# Patient Record
Sex: Female | Born: 1995 | Race: Black or African American | Hispanic: No | Marital: Single | Smoking: Former smoker
Health system: Southern US, Community
[De-identification: ages and names within clinical notes are randomized; demographics above are authoritative.]

## PROBLEM LIST (undated history)

## (undated) DIAGNOSIS — R51 Headache: Secondary | ICD-10-CM

## (undated) DIAGNOSIS — D689 Coagulation defect, unspecified: Secondary | ICD-10-CM

## (undated) DIAGNOSIS — R7303 Prediabetes: Secondary | ICD-10-CM

## (undated) DIAGNOSIS — E282 Polycystic ovarian syndrome: Secondary | ICD-10-CM

## (undated) DIAGNOSIS — F419 Anxiety disorder, unspecified: Secondary | ICD-10-CM

## (undated) DIAGNOSIS — I82409 Acute embolism and thrombosis of unspecified deep veins of unspecified lower extremity: Secondary | ICD-10-CM

## (undated) DIAGNOSIS — D649 Anemia, unspecified: Secondary | ICD-10-CM

## (undated) HISTORY — DX: Anxiety disorder, unspecified: F41.9

## (undated) HISTORY — DX: Coagulation defect, unspecified: D68.9

## (undated) HISTORY — DX: Anemia, unspecified: D64.9

## (undated) HISTORY — DX: Acute embolism and thrombosis of unspecified deep veins of unspecified lower extremity: I82.409

## (undated) HISTORY — DX: Polycystic ovarian syndrome: E28.2

## (undated) HISTORY — PX: WISDOM TOOTH EXTRACTION: SHX21

## (undated) HISTORY — PX: NO PAST SURGERIES: SHX2092

---
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
## 2013-08-31 ENCOUNTER — Inpatient Hospital Stay (HOSPITAL_COMMUNITY)
Admission: AD | Admit: 2013-08-31 | Discharge: 2013-08-31 | Disposition: A | Discharge status: Home / Self Care | Payer: Self-pay | Source: Ambulatory Visit | Attending: Obstetrics and Gynecology | Admitting: Obstetrics and Gynecology

## 2013-08-31 ENCOUNTER — Encounter (HOSPITAL_COMMUNITY): Payer: Self-pay

## 2013-08-31 DIAGNOSIS — F17200 Nicotine dependence, unspecified, uncomplicated: Secondary | ICD-10-CM | POA: Insufficient documentation

## 2013-08-31 DIAGNOSIS — N938 Other specified abnormal uterine and vaginal bleeding: Secondary | ICD-10-CM | POA: Insufficient documentation

## 2013-08-31 DIAGNOSIS — N939 Abnormal uterine and vaginal bleeding, unspecified: Secondary | ICD-10-CM

## 2013-08-31 DIAGNOSIS — N926 Irregular menstruation, unspecified: Secondary | ICD-10-CM

## 2013-08-31 DIAGNOSIS — N949 Unspecified condition associated with female genital organs and menstrual cycle: Secondary | ICD-10-CM | POA: Insufficient documentation

## 2013-08-31 DIAGNOSIS — N925 Other specified irregular menstruation: Secondary | ICD-10-CM | POA: Insufficient documentation

## 2013-08-31 HISTORY — DX: Headache: R51

## 2013-08-31 LAB — CBC
HCT: 37.1 % (ref 36.0–46.0)
Hemoglobin: 12.1 g/dL (ref 12.0–15.0)
MCH: 20.1 pg — AB (ref 26.0–34.0)
MCHC: 32.6 g/dL (ref 30.0–36.0)
MCV: 61.5 fL — AB (ref 78.0–100.0)
PLATELETS: 274 10*3/uL (ref 150–400)
RBC: 6.03 MIL/uL — AB (ref 3.87–5.11)
RDW: 19.2 % — AB (ref 11.5–15.5)
WBC: 6.2 10*3/uL (ref 4.0–10.5)

## 2013-08-31 LAB — URINE MICROSCOPIC-ADD ON

## 2013-08-31 LAB — URINALYSIS, ROUTINE W REFLEX MICROSCOPIC
Bilirubin Urine: NEGATIVE
Glucose, UA: NEGATIVE mg/dL
Ketones, ur: NEGATIVE mg/dL
LEUKOCYTES UA: NEGATIVE
NITRITE: NEGATIVE
PROTEIN: NEGATIVE mg/dL
SPECIFIC GRAVITY, URINE: 1.015 (ref 1.005–1.030)
UROBILINOGEN UA: 1 mg/dL (ref 0.0–1.0)
pH: 7 (ref 5.0–8.0)

## 2013-08-31 LAB — WET PREP, GENITAL
Clue Cells Wet Prep HPF POC: NONE SEEN
Trich, Wet Prep: NONE SEEN
WBC WET PREP: NONE SEEN
YEAST WET PREP: NONE SEEN

## 2013-08-31 LAB — POCT PREGNANCY, URINE: PREG TEST UR: NEGATIVE

## 2013-08-31 MED ORDER — NORGESTIMATE-ETH ESTRADIOL 0.25-35 MG-MCG PO TABS: 1.0000 | ORAL_TABLET | Freq: Every day | ORAL | Status: DC

## 2013-08-31 NOTE — MAU Note (Signed)
Pt states changing pad/tampon at least every 1.5 hours. Has back pain and has felt light headed.

## 2013-08-31 NOTE — MAU Note (Signed)
Bleeding x 3 weeks, last period only lasted 4 days.  Lower back pain today, feeling very tired.

## 2013-08-31 NOTE — MAU Provider Note (Signed)
Chief Complaint: Vaginal Bleeding   First Provider Initiated Contact with Patient 08/31/13 1349     SUBJECTIVE HPI: Kathleen Bridges is a 18 y.o. G0P0 who presents to maternity admissions reporting vaginal bleeding x 3 weeks, some days heavy, some days lighter.  Today she is changing 1 tampon or pad Q 1.5 hours.  She denies STD risks but reports she is interested in hormonal contraception today.  She denies family or personal hx of blood clots.  She notes she recently lost 20 lbs and wonders if this is contributing to her irregular period.  Patient's last menstrual period was 08/13/2013.  Prior to this episode, periods were regular with the occasional skipped period.  She denies abdominal pain, vaginal itching/burning, urinary symptoms, h/a, dizziness, n/v, or fever/chills.    Past Medical History  Diagnosis Date  . Prediabetes   . BJYNWGNF(621.3)    Past Surgical History  Procedure Laterality Date  . No past surgeries     History   Social History  . Marital Status: Married    Spouse Name: N/A    Number of Children: N/A  . Years of Education: N/A   Occupational History  . Not on file.   Social History Main Topics  . Smoking status: Current Some Day Smoker    Types: Cigars  . Smokeless tobacco: Never Used  . Alcohol Use: No  . Drug Use: No  . Sexual Activity: Not Currently    Birth Control/ Protection: Abstinence   Other Topics Concern  . Not on file   Social History Narrative  . No narrative on file   No current facility-administered medications on file prior to encounter.   Current Outpatient Prescriptions on File Prior to Encounter  Medication Sig Dispense Refill  . ibuprofen (ADVIL,MOTRIN) 200 MG tablet Take 400 mg by mouth every 6 (six) hours as needed. For pain       No Known Allergies  ROS: Pertinent items in HPI  OBJECTIVE Blood pressure 124/83, pulse 89, temperature 98.4 F (36.9 C), temperature source Oral, resp. rate 20, height 6' (1.829 m), weight  170.19 kg (375 lb 3.2 oz), last menstrual period 08/13/2013. GENERAL: Well-developed, well-nourished female in no acute distress.  HEENT: Normocephalic HEART: normal rate RESP: normal effort ABDOMEN: Soft, non-tender EXTREMITIES: Nontender, no edema NEURO: Alert and oriented Pelvic exam: Cervix pink, visually closed, without lesion, moderate amount dark red bleeding, vaginal walls and external genitalia normal Bimanual exam: Cervix 0/long/high, firm, anterior, neg CMT, uterus nontender, nonenlarged, adnexa without tenderness, enlargement, or mass  LAB RESULTS Results for orders placed during the hospital encounter of 08/31/13 (from the past 24 hour(s))  URINALYSIS, ROUTINE W REFLEX MICROSCOPIC     Status: Abnormal   Collection Time    08/31/13 11:10 AM      Result Value Ref Range   Color, Urine AMBER (*) YELLOW   APPearance HAZY (*) CLEAR   Specific Gravity, Urine 1.015  1.005 - 1.030   pH 7.0  5.0 - 8.0   Glucose, UA NEGATIVE  NEGATIVE mg/dL   Hgb urine dipstick LARGE (*) NEGATIVE   Bilirubin Urine NEGATIVE  NEGATIVE   Ketones, ur NEGATIVE  NEGATIVE mg/dL   Protein, ur NEGATIVE  NEGATIVE mg/dL   Urobilinogen, UA 1.0  0.0 - 1.0 mg/dL   Nitrite NEGATIVE  NEGATIVE   Leukocytes, UA NEGATIVE  NEGATIVE  URINE MICROSCOPIC-ADD ON     Status: Abnormal   Collection Time    08/31/13 11:10 AM  Result Value Ref Range   Squamous Epithelial / LPF FEW (*) RARE   RBC / HPF TOO NUMEROUS TO COUNT  <3 RBC/hpf  POCT PREGNANCY, URINE     Status: None   Collection Time    08/31/13 11:40 AM      Result Value Ref Range   Preg Test, Ur NEGATIVE  NEGATIVE  CBC     Status: Abnormal   Collection Time    08/31/13  1:50 PM      Result Value Ref Range   WBC 6.2  4.0 - 10.5 K/uL   RBC 6.03 (*) 3.87 - 5.11 MIL/uL   Hemoglobin 12.1  12.0 - 15.0 g/dL   HCT 16.1  09.6 - 04.5 %   MCV 61.5 (*) 78.0 - 100.0 fL   MCH 20.1 (*) 26.0 - 34.0 pg   MCHC 32.6  30.0 - 36.0 g/dL   RDW 40.9 (*) 81.1 - 91.4 %    Platelets 274  150 - 400 K/uL    ASSESSMENT 1. Abnormal uterine bleeding (AUB)     PLAN Discharge home Sprintec daily to start today F/U at [facility] or [facility] for irregular menses and contraceptive management Return to MAU as needed for emergencies    Medication List         ibuprofen 200 MG tablet  Commonly known as:  ADVIL,MOTRIN  Take 400 mg by mouth every 6 (six) hours as needed. For pain     norgestimate-ethinyl estradiol 0.25-35 MG-MCG tablet  Commonly known as:  ORTHO-CYCLEN,SPRINTEC,PREVIFEM  Take 1 tablet by mouth daily.       Follow-up Information   Call [facility]. (To make follow up apointment for irregular bleeding and contraception management.)    Specialty:  Obstetrics and Gynecology   Contact information:   [address] [place] [address] [phone]      Follow up with [facility].   Contact information:   [address] [place] [address] [phone]      Sharen Counter Certified Nurse-Midwife 08/31/2013  2:25 PM

## 2013-08-31 NOTE — Discharge Instructions (Signed)
Abnormal Uterine Bleeding Abnormal uterine bleeding can affect women at various stages in life, including teenagers, women in their reproductive years, pregnant women, and women who have reached menopause. Several kinds of uterine bleeding are considered abnormal, including:  Bleeding or spotting between periods.   Bleeding after sexual intercourse.   Bleeding that is heavier or more than normal.   Periods that last longer than usual.  Bleeding after menopause.  Many cases of abnormal uterine bleeding are minor and simple to treat, while others are more serious. Any type of abnormal bleeding should be evaluated by your health care provider. Treatment will depend on the cause of the bleeding. HOME CARE INSTRUCTIONS Monitor your condition for any changes. The following actions may help to alleviate any discomfort you are experiencing:  Avoid the use of tampons and douches as directed by your health care provider.  Change your pads frequently. You should get regular pelvic exams and Pap tests. Keep all follow-up appointments for diagnostic tests as directed by your health care provider.  SEEK MEDICAL CARE IF:   Your bleeding lasts more than 1 week.   You feel dizzy at times.  SEEK IMMEDIATE MEDICAL CARE IF:   You pass out.   You are changing pads every 15 to 30 minutes.   You have abdominal pain.  You have a fever.   You become sweaty or weak.   You are passing large blood clots from the vagina.   You start to feel nauseous and vomit. MAKE SURE YOU:   Understand these instructions.  Will watch your condition.  Will get help right away if you are not doing well or get worse. Document Released: 12/18/2004 Document Revised: 12/23/2012 Document Reviewed: 07/17/2012 ExitCare Patient Information 2015 [employer]. This information is not intended to replace advice given to you by your health care provider. Make sure you discuss any questions you have with your  health care provider.  

## 2013-09-01 LAB — GC/CHLAMYDIA PROBE AMP
CT Probe RNA: NEGATIVE
GC Probe RNA: NEGATIVE

## 2013-09-01 NOTE — MAU Provider Note (Signed)
Attestation of Attending Supervision of Advanced Practitioner (CNM/NP): Evaluation and management procedures were performed by the Advanced Practitioner under my supervision and collaboration.  I have reviewed the Advanced Practitioner's note and chart, and I agree with the management and plan.  Zain Bingman 09/01/2013 7:19 AM   

## 2014-11-30 ENCOUNTER — Emergency Department
Admission: EM | Admit: 2014-11-30 | Discharge: 2014-11-30 | Disposition: A | Discharge status: Home / Self Care | Payer: Self-pay | Attending: Emergency Medicine | Admitting: Emergency Medicine

## 2014-11-30 ENCOUNTER — Emergency Department: Payer: Self-pay

## 2014-11-30 ENCOUNTER — Encounter: Payer: Self-pay | Admitting: Emergency Medicine

## 2014-11-30 DIAGNOSIS — M25511 Pain in right shoulder: Secondary | ICD-10-CM

## 2014-11-30 DIAGNOSIS — F17210 Nicotine dependence, cigarettes, uncomplicated: Secondary | ICD-10-CM | POA: Insufficient documentation

## 2014-11-30 DIAGNOSIS — Z79899 Other long term (current) drug therapy: Secondary | ICD-10-CM | POA: Insufficient documentation

## 2014-11-30 MED ORDER — CYCLOBENZAPRINE HCL 10 MG PO TABS: 10.0000 mg | ORAL_TABLET | Freq: Three times a day (TID) | ORAL | Status: DC | PRN

## 2014-11-30 MED ORDER — CYCLOBENZAPRINE HCL 10 MG PO TABS
10.0000 mg | ORAL_TABLET | Freq: Once | ORAL | Status: AC
  Administered 2014-11-30: 10 mg via ORAL
  Filled 2014-11-30: qty 1

## 2014-11-30 NOTE — Discharge Instructions (Signed)

## 2014-11-30 NOTE — ED Provider Notes (Signed)
[facility] Emergency Department Provider Note  ____________________________________________  Time seen: 2:00 AM  I have reviewed the triage vital signs and the nursing notes.   HISTORY  Chief Complaint Shoulder Pain     HPI Kathleen Bridges is a 19 y.o. female presents with nontraumatic right shoulder pain with onset today. Patient denies any known injury. Patient states pain is worse with movement of the shoulder current pain score 7 out of 10. Patient denies any weakness or numbness in that arm.  Past Medical History  Diagnosis Date  . Prediabetes   . Headache(784.0)     There are no active problems to display for this patient.   Past Surgical History  Procedure Laterality Date  . No past surgeries      Current Outpatient Rx  Name  Route  Sig  Dispense  Refill  . cyclobenzaprine (FLEXERIL) 10 MG tablet   Oral   Take 1 tablet (10 mg total) by mouth 3 (three) times daily as needed for muscle spasms.   30 tablet   0   . ibuprofen (ADVIL,MOTRIN) 200 MG tablet   Oral   Take 400 mg by mouth every 6 (six) hours as needed. For pain         . norgestimate-ethinyl estradiol (ORTHO-CYCLEN,SPRINTEC,PREVIFEM) 0.25-35 MG-MCG tablet   Oral   Take 1 tablet by mouth daily.   1 Package   11     Allergies Review of patient's allergies indicates no known allergies.  No family history on file.  Social History Social History  Substance Use Topics  . Smoking status: Current Some Day Smoker    Types: Cigars  . Smokeless tobacco: Never Used  . Alcohol Use: No    Review of Systems  Constitutional: Negative for fever. Eyes: Negative for visual changes. ENT: Negative for sore throat. Cardiovascular: Negative for chest pain. Respiratory: Negative for shortness of breath. Gastrointestinal: Negative for abdominal pain, vomiting and diarrhea. Genitourinary: Negative for dysuria. Musculoskeletal: Negative for back pain. Positive for right  shoulder pain Skin: Negative for rash. Neurological: Negative for headaches, focal weakness or numbness.   10-point ROS otherwise negative.  ____________________________________________   PHYSICAL EXAM:  VITAL SIGNS: ED Triage Vitals  Enc Vitals Group     BP 11/30/14 0024 130/90 mmHg     Pulse Rate 11/30/14 0024 110     Resp 11/30/14 0024 20     Temp 11/30/14 0024 98.1 F (36.7 C)     Temp Source 11/30/14 0024 Oral     SpO2 11/30/14 0024 99 %     Weight 11/30/14 0024 310 lb (140.615 kg)     Height 11/30/14 0024 6' (1.829 m)     Head Cir --      Peak Flow --      Pain Score 11/30/14 0031 8     Pain Loc --      Pain Edu? --      Excl. in GC? --     Constitutional: Alert and oriented. Well appearing and in no distress. Eyes: Conjunctivae are normal. PERRL. Normal extraocular movements. ENT   Head: Normocephalic and atraumatic.   Nose: No congestion/rhinnorhea.   Mouth/Throat: Mucous membranes are moist.   Neck: No stridor. Hematological/Lymphatic/Immunilogical: No cervical lymphadenopathy. Cardiovascular: Normal rate, regular rhythm. Normal and symmetric distal pulses are present in all extremities. No murmurs, rubs, or gallops. Respiratory: Normal respiratory effort without tachypnea nor retractions. Breath sounds are clear and equal bilaterally. No wheezes/rales/rhonchi. Gastrointestinal: Soft and nontender.  No distention. There is no CVA tenderness. Genitourinary: deferred Musculoskeletal: Pain with passive and active range of motion of the right shoulder. No joint effusions.  No lower extremity tenderness nor edema. Neurologic:  Normal speech and language. No gross focal neurologic deficits are appreciated. Speech is normal.  Skin:  Skin is warm, dry and intact. No rash noted.   RADIOLOGY   DG Shoulder Right (Final result) Result time: 11/30/14 02:16:07   Final result by Rad Results In Interface (11/30/14 02:16:07)   Narrative:   CLINICAL DATA:  Nontraumatic right shoulder pain, onset today.  EXAM: RIGHT SHOULDER - 2+ VIEW  COMPARISON: None.  FINDINGS: There is no evidence of fracture or dislocation. There is no evidence of arthropathy or other focal bone abnormality. Soft tissues are unremarkable.  IMPRESSION: Negative.   Electronically Signed By: Ellery Plunk M.D. On: 11/30/2014 02:16            INITIAL IMPRESSION / ASSESSMENT AND PLAN / ED COURSE  Pertinent labs & imaging results that were available during my care of the patient were reviewed by me and considered in my medical decision making (see chart for details).  Sling applied  ____________________________________________   FINAL CLINICAL IMPRESSION(S) / ED DIAGNOSES  Final diagnoses:  Right shoulder pain      Darci Current, MD 11/30/14 [phone]

## 2014-11-30 NOTE — ED Notes (Addendum)
Patient ambulatory to triage with steady gait, without difficulty or distress noted; pt reports right shoulder pain today with no known injury, st "feels like its locking on me"; st pain radiating into right side neck especially when turning head

## 2017-05-16 DIAGNOSIS — E039 Hypothyroidism, unspecified: Secondary | ICD-10-CM | POA: Insufficient documentation

## 2017-05-16 DIAGNOSIS — R7303 Prediabetes: Secondary | ICD-10-CM | POA: Insufficient documentation

## 2017-05-30 DIAGNOSIS — D509 Iron deficiency anemia, unspecified: Secondary | ICD-10-CM | POA: Insufficient documentation

## 2017-05-30 DIAGNOSIS — I340 Nonrheumatic mitral (valve) insufficiency: Secondary | ICD-10-CM | POA: Insufficient documentation

## 2017-05-31 DIAGNOSIS — J392 Other diseases of pharynx: Secondary | ICD-10-CM | POA: Insufficient documentation

## 2017-05-31 DIAGNOSIS — F411 Generalized anxiety disorder: Secondary | ICD-10-CM | POA: Insufficient documentation

## 2017-06-01 DIAGNOSIS — I82409 Acute embolism and thrombosis of unspecified deep veins of unspecified lower extremity: Secondary | ICD-10-CM | POA: Insufficient documentation

## 2018-07-10 DIAGNOSIS — N6019 Diffuse cystic mastopathy of unspecified breast: Secondary | ICD-10-CM | POA: Insufficient documentation

## 2018-07-16 DIAGNOSIS — Z91148 Patient's other noncompliance with medication regimen for other reason: Secondary | ICD-10-CM | POA: Insufficient documentation

## 2018-07-16 DIAGNOSIS — Z86718 Personal history of other venous thrombosis and embolism: Secondary | ICD-10-CM | POA: Insufficient documentation

## 2019-06-03 ENCOUNTER — Emergency Department (HOSPITAL_COMMUNITY)
Admission: EM | Admit: 2019-06-03 | Discharge: 2019-06-03 | Disposition: A | Discharge status: Home / Self Care | Payer: No Typology Code available for payment source | Attending: Emergency Medicine | Admitting: Emergency Medicine

## 2019-06-03 ENCOUNTER — Emergency Department (HOSPITAL_BASED_OUTPATIENT_CLINIC_OR_DEPARTMENT_OTHER): Payer: No Typology Code available for payment source

## 2019-06-03 ENCOUNTER — Encounter (HOSPITAL_COMMUNITY): Payer: Self-pay | Admitting: Emergency Medicine

## 2019-06-03 DIAGNOSIS — Z86718 Personal history of other venous thrombosis and embolism: Secondary | ICD-10-CM | POA: Diagnosis not present

## 2019-06-03 DIAGNOSIS — M79604 Pain in right leg: Secondary | ICD-10-CM | POA: Insufficient documentation

## 2019-06-03 DIAGNOSIS — Z720 Tobacco use: Secondary | ICD-10-CM | POA: Diagnosis not present

## 2019-06-03 MED ORDER — APIXABAN 5 MG PO TABS: ORAL_TABLET | ORAL | 0 refills | Status: DC

## 2019-06-03 NOTE — Discharge Instructions (Signed)
You were seen in the emergency room today with right leg pain.  Your ultrasound did not show DVT but since you are supposed to still be on Eliquis I am refilling this medication for you.  Your primary care doctor will need to provide further refills.  I have listed a primary care doctor but may need to go with someone that takes your insurance.  Please check your network and call a primary care doctor first thing tomorrow morning.  Return to the emergency department any new or suddenly worsening symptoms.   While on Eliquis you cannot take ibuprofen, aspirin, other anti-inflammatory medicines.  If you develop severe headache, black or bright red in your bowel movements, or other severe symptoms you should return to the emergency department immediately.

## 2019-06-03 NOTE — Progress Notes (Signed)
Venous duplex       has been completed. Preliminary results can be found under CV proc through chart review. Gage Weant, BS, RDMS, RVT   

## 2019-06-03 NOTE — ED Triage Notes (Signed)
Pt reports intermittent right lower leg pain starting in back of right knee- hx of dvt. Pt states she has also noticed some tingling in her toes.

## 2019-06-03 NOTE — ED Notes (Signed)
Discharge instructions discussed with pt. Pt verbalized understanding. Pt stable and ambulatory. No signature pad available. 

## 2019-06-03 NOTE — ED Provider Notes (Signed)
Emergency Department Provider Note   I have reviewed the triage vital signs and the nursing notes.   HISTORY  Chief Complaint Leg Pain   HPI Kathleen Bridges is a 24 y.o. female with past medical history of DVT presents to the emergency department with right leg pain.  Patient has intermittent cramping pain starting in the back of the right knee and radiating down the leg with no modifying factors.  She occasionally has tingling in the toes.  She has not noticed redness or swelling to the legs.  She is been using her compression stockings.  She is supposed to be on Eliquis from her prior DVT but ran out after moving to the area and not having a PCP.  She has been out of her meds for the past 3 months.  She denies any persistent chest pain or shortness of breath.  No new or suddenly worsening symptoms.  When she began to have the pain again she became concerned and so presented to the ED for evaluation.   Past Medical History:  Diagnosis Date  . Headache(784.0)   . Prediabetes     There are no problems to display for this patient.   Past Surgical History:  Procedure Laterality Date  . NO PAST SURGERIES      Allergies Patient has no known allergies.  No family history on file.  Social History Social History   Tobacco Use  . Smoking status: Current Some Day Smoker    Types: Cigars  . Smokeless tobacco: Never Used  Substance Use Topics  . Alcohol use: No  . Drug use: No    Review of Systems  Constitutional: No fever/chills Cardiovascular: Denies chest pain. Respiratory: Denies shortness of breath. Gastrointestinal: No abdominal pain.   Genitourinary: Negative for dysuria. Musculoskeletal: Negative for back pain. Neurological: Negative for headaches.  10-point ROS otherwise negative.  ____________________________________________   PHYSICAL EXAM:  VITAL SIGNS: ED Triage Vitals  Enc Vitals Group     BP 06/03/19 1804 (!) 139/101     Pulse Rate 06/03/19  1804 91     Resp 06/03/19 1804 18     Temp 06/03/19 1804 98.4 F (36.9 C)     Temp Source 06/03/19 1804 Oral     SpO2 06/03/19 1804 96 %    Constitutional: Alert and oriented. Well appearing and in no acute distress. Eyes: Conjunctivae are normal.  Head: Atraumatic. Nose: No congestion/rhinnorhea. Mouth/Throat: Mucous membranes are moist.  Neck: No stridor.  Cardiovascular: Normal rate, regular rhythm.  Respiratory: Normal respiratory effort.  Gastrointestinal: No distention.  Musculoskeletal: Mild right calf tenderness without erythema or swelling.  No apparent joint swelling.  Neurologic:  Normal speech and language. Skin:  Skin is warm, dry and intact. No rash noted.  ____________________________________________  RADIOLOGY  VAS [place] LOWER EXTREMITY VENOUS (DVT) (ONLY [facility] & [facility])  Result Date: 06/03/2019  Lower Venous DVTStudy Indications: Right leg pain, Hx DVT years ago at outside state facility.  Limitations: Body habitus. Performing Technologist: June Leap RDMS, RVT  Examination Guidelines: A complete evaluation includes B-mode imaging, spectral Doppler, color Doppler, and power Doppler as needed of all accessible portions of each vessel. Bilateral testing is considered an integral part of a complete examination. Limited examinations for reoccurring indications may be performed as noted. The reflux portion of the exam is performed with the patient in reverse Trendelenburg.  +---------+---------------+---------+-----------+----------+--------------+ RIGHT    CompressibilityPhasicitySpontaneityPropertiesThrombus Aging +---------+---------------+---------+-----------+----------+--------------+ CFV      Full  Yes      Yes                                 +---------+---------------+---------+-----------+----------+--------------+ SFJ      Full                                                         +---------+---------------+---------+-----------+----------+--------------+ FV Prox  Full                                                        +---------+---------------+---------+-----------+----------+--------------+ FV Mid   Full                                                        +---------+---------------+---------+-----------+----------+--------------+ FV DistalFull                                                        +---------+---------------+---------+-----------+----------+--------------+ PFV      Full                                                        +---------+---------------+---------+-----------+----------+--------------+ POP      Full           Yes      Yes                                 +---------+---------------+---------+-----------+----------+--------------+ PTV      Full                                                        +---------+---------------+---------+-----------+----------+--------------+ PERO     Full                                                        +---------+---------------+---------+-----------+----------+--------------+ GSV      Full                                                        +---------+---------------+---------+-----------+----------+--------------+   +----+---------------+---------+-----------+----------+--------------+ LEFTCompressibilityPhasicitySpontaneityPropertiesThrombus Aging +----+---------------+---------+-----------+----------+--------------+ CFV Full  Yes      Yes                                 +----+---------------+---------+-----------+----------+--------------+     Summary: RIGHT: - There is no evidence of deep vein thrombosis in the lower extremity.  - No cystic structure found in the popliteal fossa.  LEFT: - No evidence of common femoral vein obstruction.  *See table(s) above for measurements and observations.    Preliminary      ____________________________________________   PROCEDURES  Procedure(s) performed:   Procedures  None  ____________________________________________   INITIAL IMPRESSION / ASSESSMENT AND PLAN / ED COURSE  Pertinent labs & imaging results that were available during my care of the patient were reviewed by me and considered in my medical decision making (see chart for details).   Patient with prior history of DVT presents to the emergency department with right calf pain.  DVT ultrasound reviewed showing no evidence of DVT.  Patient is supposed to be on Eliquis but ran out 3 months ago after moving to the area.  Plan to restart this medicine here. Discussed potential complications and return precautions. Discussed that if she develops chest pain or shortness of breath she is to return immediately.  Will provide contact information for local PCP. Patient pleased at discharge.    ____________________________________________  FINAL CLINICAL IMPRESSION(S) / ED DIAGNOSES  Final diagnoses:  Right leg pain    NEW OUTPATIENT MEDICATIONS STARTED DURING THIS VISIT:  New Prescriptions   APIXABAN (ELIQUIS) 5 MG TABS TABLET    Take 2 tablets (10mg ) twice daily for 7 days, then 1 tablet (5mg ) twice daily    Note:  This document was prepared using Dragon voice recognition software and may include unintentional dictation errors.  , MD, [facility]    Veronique Warga, Alona Bene, MD 06/03/19 [phone]

## 2019-10-06 ENCOUNTER — Emergency Department (HOSPITAL_COMMUNITY): Payer: No Typology Code available for payment source

## 2019-10-06 ENCOUNTER — Encounter (HOSPITAL_COMMUNITY): Payer: Self-pay | Admitting: Student

## 2019-10-06 ENCOUNTER — Emergency Department (HOSPITAL_COMMUNITY)
Admission: EM | Admit: 2019-10-06 | Discharge: 2019-10-06 | Disposition: A | Discharge status: Home / Self Care | Payer: No Typology Code available for payment source | Attending: Emergency Medicine | Admitting: Emergency Medicine

## 2019-10-06 ENCOUNTER — Other Ambulatory Visit: Payer: Self-pay

## 2019-10-06 DIAGNOSIS — R079 Chest pain, unspecified: Secondary | ICD-10-CM | POA: Insufficient documentation

## 2019-10-06 DIAGNOSIS — R0682 Tachypnea, not elsewhere classified: Secondary | ICD-10-CM | POA: Insufficient documentation

## 2019-10-06 DIAGNOSIS — Z7901 Long term (current) use of anticoagulants: Secondary | ICD-10-CM | POA: Diagnosis not present

## 2019-10-06 DIAGNOSIS — F17290 Nicotine dependence, other tobacco product, uncomplicated: Secondary | ICD-10-CM | POA: Diagnosis not present

## 2019-10-06 LAB — CBC WITH DIFFERENTIAL/PLATELET
Abs Immature Granulocytes: 0.03 10*3/uL (ref 0.00–0.07)
Basophils Absolute: 0 10*3/uL (ref 0.0–0.1)
Basophils Relative: 0 %
Eosinophils Absolute: 0.2 10*3/uL (ref 0.0–0.5)
Eosinophils Relative: 3 %
HCT: 35.3 % — ABNORMAL LOW (ref 36.0–46.0)
Hemoglobin: 11.1 g/dL — ABNORMAL LOW (ref 12.0–15.0)
Immature Granulocytes: 0 %
Lymphocytes Relative: 28 %
Lymphs Abs: 2 10*3/uL (ref 0.7–4.0)
MCH: 20.1 pg — ABNORMAL LOW (ref 26.0–34.0)
MCHC: 31.4 g/dL (ref 30.0–36.0)
MCV: 63.9 fL — ABNORMAL LOW (ref 80.0–100.0)
Monocytes Absolute: 0.5 10*3/uL (ref 0.1–1.0)
Monocytes Relative: 6 %
Neutro Abs: 4.5 10*3/uL (ref 1.7–7.7)
Neutrophils Relative %: 63 %
Platelets: DECREASED 10*3/uL (ref 150–400)
RBC: 5.52 MIL/uL — ABNORMAL HIGH (ref 3.87–5.11)
RDW: 19.3 % — ABNORMAL HIGH (ref 11.5–15.5)
WBC: 7.2 10*3/uL (ref 4.0–10.5)
nRBC: 0 % (ref 0.0–0.2)

## 2019-10-06 LAB — COMPREHENSIVE METABOLIC PANEL
ALT: 18 U/L (ref 0–44)
AST: 24 U/L (ref 15–41)
Albumin: 4.2 g/dL (ref 3.5–5.0)
Alkaline Phosphatase: 50 U/L (ref 38–126)
Anion gap: 9 (ref 5–15)
BUN: 15 mg/dL (ref 6–20)
CO2: 27 mmol/L (ref 22–32)
Calcium: 9.2 mg/dL (ref 8.9–10.3)
Chloride: 104 mmol/L (ref 98–111)
Creatinine, Ser: 0.82 mg/dL (ref 0.44–1.00)
GFR calc Af Amer: >60 mL/min (ref >60)
GFR calc non Af Amer: >60 mL/min (ref >60)
Glucose, Bld: 96 mg/dL (ref 70–99)
Potassium: 3.9 mmol/L (ref 3.5–5.1)
Sodium: 140 mmol/L (ref 135–145)
Total Bilirubin: 0.8 mg/dL (ref 0.3–1.2)
Total Protein: 8.2 g/dL — ABNORMAL HIGH (ref 6.5–8.1)

## 2019-10-06 LAB — TROPONIN I (HIGH SENSITIVITY): Troponin I (High Sensitivity): <2 ng/L (ref <18)

## 2019-10-06 MED ORDER — FENTANYL CITRATE (PF) 100 MCG/2ML IJ SOLN
50.0000 ug | Freq: Once | INTRAMUSCULAR | Status: AC
  Administered 2019-10-06: 50 ug via INTRAVENOUS
  Filled 2019-10-06: qty 2

## 2019-10-06 MED ORDER — METHOCARBAMOL 500 MG PO TABS: 500.0000 mg | ORAL_TABLET | Freq: Three times a day (TID) | ORAL | 0 refills | Status: DC | PRN

## 2019-10-06 MED ORDER — SODIUM CHLORIDE (PF) 0.9 % IJ SOLN
INTRAMUSCULAR | Status: AC
  Filled 2019-10-06: qty 50

## 2019-10-06 MED ORDER — IOHEXOL 350 MG/ML SOLN
100.0000 mL | Freq: Once | INTRAVENOUS | Status: AC | PRN
  Administered 2019-10-06: 100 mL via INTRAVENOUS

## 2019-10-06 MED ORDER — APIXABAN 5 MG PO TABS: ORAL_TABLET | ORAL | 0 refills | Status: DC

## 2019-10-06 NOTE — ED Triage Notes (Signed)
Pt c/o right chest pain that radiates to her ribs denies any event or injury but admits to intermittent SOB VSS

## 2019-10-06 NOTE — ED Provider Notes (Signed)
[facility]-EMERGENCY DEPT Provider Note   CSN: [id number] Arrival date & time: 10/06/19  0218     History Chief Complaint  Patient presents with   Chest Pain    Kathleen Bridges is a 24 y.o. female with a history of tobacco abuse and prior DVT not currently compliant with anticoagulation who presents to the emergency department with complaints of chest pain for the past 3 days.  Patient states the pain is located to her right chest/shoulder, it is constant, worse with deep breathing, no alleviating factors.  She has associated shortness of breath especially with activity.  She was diagnosed with a DVT of her right lower extremity in 2019 and was placed on Eliquis, she has not been on this for several months, she was seen in the ED in June of this year and given a new prescription for this which she did not fill.  She states she does adamantly have right lower extremity pain since her initial diagnosis of DVT.  She denies fever, chills, diaphoresis, syncope, cough, hemoptysis, recent long travel/trauma/surgery/hospitalization, prior history of cancer, or hormone use.  She does do some lifting at work, however no specific change in this or injury recently.  HPI     Past Medical History:  Diagnosis Date   Headache(784.0)    Prediabetes     There are no problems to display for this patient.   Past Surgical History:  Procedure Laterality Date   NO PAST SURGERIES       OB History    Gravida  0   Para      Term      Preterm      AB      Living        SAB      TAB      Ectopic      Multiple      Live Births              No family history on file.  Social History   Tobacco Use   Smoking status: Current Some Day Smoker    Types: Cigars   Smokeless tobacco: Never Used  Substance Use Topics   Alcohol use: No   Drug use: No    Home Medications Prior to Admission medications   Medication Sig Start Date End Date Taking?  Authorizing Provider  apixaban (ELIQUIS) 5 MG TABS tablet Take 2 tablets (10mg ) twice daily for 7 days, then 1 tablet (5mg ) twice daily 06/03/19   Long, , MD  cyclobenzaprine (FLEXERIL) 10 MG tablet Take 1 tablet (10 mg total) by mouth 3 (three) times daily as needed for muscle spasms. 11/30/14   Arlyss Repress, MD  norgestimate-ethinyl estradiol (ORTHO-CYCLEN,SPRINTEC,PREVIFEM) 0.25-35 MG-MCG tablet Take 1 tablet by mouth daily. 08/31/13   Leftwich-Kirby, 09-[birth year]-1998, CNM    Allergies    Patient has no known allergies.  Review of Systems   Review of Systems  Constitutional: Negative for chills, diaphoresis and fever.  Respiratory: Positive for shortness of breath. Negative for cough.   Cardiovascular: Positive for chest pain. Negative for leg swelling.  Gastrointestinal: Negative for abdominal pain and vomiting.  Genitourinary: Negative for dysuria.  Musculoskeletal: Positive for myalgias.  Neurological: Negative for syncope.  All other systems reviewed and are negative.   Physical Exam Updated Vital Signs BP (!) 156/114 (BP Location: Right Arm)    Pulse (!) 108    Temp 98.4 F (36.9 C) (Oral)  Resp 19    Ht 6' (1.829 m)    Wt (!) 191 kg    SpO2 100%    BMI 57.10 kg/m   Physical Exam Vitals and nursing note reviewed.  Constitutional:      General: She is not in acute distress.    Appearance: She is well-developed. She is obese. She is not toxic-appearing.  HENT:     Head: Normocephalic and atraumatic.  Eyes:     General:        Right eye: No discharge.        Left eye: No discharge.     Conjunctiva/sclera: Conjunctivae normal.  Cardiovascular:     Rate and Rhythm: Normal rate and regular rhythm.     Pulses:          Radial pulses are 2+ on the right side and 2+ on the left side.       Dorsalis pedis pulses are 2+ on the right side and 2+ on the left side.  Pulmonary:     Effort: Tachypnea (mild intermittent) present. No respiratory distress.     Breath sounds:  Normal breath sounds. No wheezing, rhonchi or rales.  Chest:     Chest wall: Tenderness (right anterior chest wall) present.  Abdominal:     General: There is no distension.     Palpations: Abdomen is soft.     Tenderness: There is no abdominal tenderness.  Musculoskeletal:     Cervical back: Neck supple.     Right lower leg: No tenderness. No edema.     Left lower leg: No tenderness. No edema.     Comments: Intact range of motion throughout upper extremities.  Some tenderness the right glenohumeral joint diffusely.  Lower extremities without pitting edema, significant tenderness, or range of motion limitation.  No erythema, increased warmth, or ecchymosis.  Skin:    General: Skin is warm and dry.     Findings: No rash.  Neurological:     Mental Status: She is alert.     Comments: Clear speech.   Psychiatric:        Behavior: Behavior normal.     ED Results / Procedures / Treatments   Labs (all labs ordered are listed, but only abnormal results are displayed) Labs Reviewed  COMPREHENSIVE METABOLIC PANEL - Abnormal; Notable for the following components:      Result Value   Total Protein 8.2 (*)    All other components within normal limits  CBC WITH DIFFERENTIAL/PLATELET - Abnormal; Notable for the following components:   RBC 5.52 (*)    Hemoglobin 11.1 (*)    HCT 35.3 (*)    MCV 63.9 (*)    MCH 20.1 (*)    RDW 19.3 (*)    All other components within normal limits  TROPONIN I (HIGH SENSITIVITY)    EKG EKG Interpretation  Date/Time:  Tuesday October 06 2019 02:34:15 EDT Ventricular Rate:  102 PR Interval:    QRS Duration: 97 QT Interval:  332 QTC Calculation: 433 R Axis:   62 Text Interpretation: Sinus tachycardia LAE, consider biatrial enlargement 12 Lead; Mason-Likar Confirmed by Gilda CreasePollina, Christopher J [phone]([id number]) on 10/06/2019 3:17:29 AM   Radiology CT Angio Chest PE W/Cm &/Or Wo Cm  Result Date: 10/06/2019 CLINICAL DATA:  Pulmonary embolism suspected, high  probability. EXAM: CT ANGIOGRAPHY CHEST WITH CONTRAST TECHNIQUE: Multidetector CT imaging of the chest was performed using the standard protocol during bolus administration of intravenous contrast. Multiplanar CT image reconstructions and MIPs  were obtained to evaluate the vascular anatomy. CONTRAST:  OMNIPAQUE IOHEXOL 350 MG/ML SOLN COMPARISON:  None. FINDINGS: Cardiovascular: Soft tissue attenuation significantly limits visualization of the pulmonary arteries. No evidence of pulmonary embolism. Normal appearance of the aorta. Normal heart size with no pericardial effusion. Mediastinum/Nodes: Negative for adenopathy, mass, or pneumomediastinum. Lungs/Pleura: Low volume chest. There is no edema, consolidation, effusion, or pneumothorax. Upper Abdomen: Negative Musculoskeletal: Negative Review of the MIP images confirms the above findings. IMPRESSION: Negative, but notably limited, chest CTA. Electronically Signed   By: Marnee Spring M.D.   On: 10/06/2019 06:06    Procedures Procedures (including critical care time)  Medications Ordered in ED Medications  sodium chloride (PF) 0.9 % injection (has no administration in time range)  fentaNYL (SUBLIMAZE) injection 50 mcg (50 mcg Intravenous Given 10/06/19 0358)  iohexol (OMNIPAQUE) 350 MG/ML injection 100 mL (100 mLs Intravenous Contrast Given 10/06/19 0514)  fentaNYL (SUBLIMAZE) injection 50 mcg (50 mcg Intravenous Given 10/06/19 4132)    ED Course  I have reviewed the triage vital signs and the nursing notes.  Pertinent labs & imaging results that were available during my care of the patient were reviewed by me and considered in my medical decision making (see chart for details).    MDM Rules/Calculators/A&P                         Patient presents to the ED with complaints of chest pain.  Patient is nontoxic, mildly tachycardic, intermittently tachypneic changing into her gown. Lungs clear.  Does have some chest wall & shoulder tenderness  in area of pain. DDX: PE, ACS, dissection, pneumothorax, pneumonia, pericarditis, MSK, anxiety.    Additional history obtained:  Additional history obtained from chart review & nursing note review.  EKG: No STEMI.  Lab Tests:  I Ordered, reviewed, and interpreted labs, which included:  CBC: mild anemia, no previous on record recently- last was 6 years ago.  CMP: Unremarkable.  Troponin: WNL  Imaging Studies ordered:  I ordered imaging studies which included CTA, I independently visualized and interpreted imaging which showed no evidence of PE however somewhat limited study.   Hear score 1, chest discomfort or greater than 6 hours, EKG without significant ischemic changes, doubt ACS.  No findings of pneumothorax, pneumonia, pulmonary edema on CT imaging.  No EKG changes to suggest pericarditis.  Her CT angio does not show an obvious PE, the study is somewhat limited, however there are no signs of right heart strain, and ultimately we are restarting patient on her previously prescribed Eliquis.  In terms of her intermittent right lower extremity pain/swelling, no signs of infection on exam, she has had this since she was diagnosed with a DVT back in 2019, she thinks this might be a bit worse but is not entirely sure, clinically she has no edema or significant tenderness, she has 2+ symmetric pulses with good perfusion, again we are restarting her Eliquis for this therefore do not feel that she needs repeat ultrasound, did have an ultrasound in June of this year that was negative with similar symptoms.  She has some tachypnea documented throughout ED course, was initially mildly tachypneic changing clothing, however on my reassessments she is not tachypneic & is resting comfortably.  In terms of underlying cause of her chest pain, no definitive etiology identified, however given reproducibility with chest wall palpation considering possible musculoskeletal pain therefore will trial muscle relaxant, no  NSAIDs secondary to restarting anticoagulation. Discount  form provided by pharmacy for eliquis to help with affordability. We will have her follow-up closely with a primary care provider. I discussed results, treatment plan, need for follow-up, and return precautions with the patient. Provided opportunity for questions, patient confirmed understanding and is in agreement with plan.   Findings and plan of care discussed with supervising physician Dr. Blinda Leatherwood who is in agreement.   Blood pressure 137/82, pulse 93, temperature 98.4 F (36.9 C), temperature source Oral, resp. rate 18, height 6' (1.829 m), weight (!) 191 kg, last menstrual period 10/06/2019, SpO2 100 %.  Portions of this note were generated with Scientist, clinical (histocompatibility and immunogenetics). Dictation errors may occur despite best attempts at proofreading.  Final Clinical Impression(s) / ED Diagnoses Final diagnoses:  Chest pain, unspecified type    Rx / DC Orders ED Discharge Orders         Ordered    apixaban (ELIQUIS) 5 MG TABS tablet        10/06/19 0625    methocarbamol (ROBAXIN) 500 MG tablet  Every 8 hours PRN        10/06/19 0625           Cherly Anderson, PA-C 10/06/19 0630    Gilda Crease, MD 10/06/19 [phone]

## 2019-10-06 NOTE — Discharge Instructions (Addendum)
You were seen in the emergency department today for chest pain.  Your CT scan did not show an obvious blood clot and your blood work was overall reassuring, your platelets were a bit difficult to evaluate on the blood work we obtained therefore we would like you to have these rechecked by your primary care provider within 1 week.  We are sending you home with a prescription for Eliquis to be restarted, it is very important that you take this medication as prescribed.  We are also sending home with Robaxin to help with pain.  - Robaxin is the muscle relaxer I have prescribed, this is meant to help with muscle tightness. Be aware that this medication may make you drowsy therefore the first time you take this it should be at a time you are in an environment where you can rest. Do not drive or operate heavy machinery when taking this medication. Do not drink alcohol or take other sedating medications with this medicine such as narcotics or benzodiazepines.   You make take Tylenol per over the counter dosing with these medications.   We have prescribed you new medication(s) today. Discuss the medications prescribed today with your pharmacist as they can have adverse effects and interactions with your other medicines including over the counter and prescribed medications. Seek medical evaluation if you start to experience new or abnormal symptoms after taking one of these medicines, seek care immediately if you start to experience difficulty breathing, feeling of your throat closing, facial swelling, or rash as these could be indications of a more serious allergic reaction  We would like you to follow-up with a primary care provider within 3 days.  Please see circled information for assistance establishing primary care.  You may also follow-up with our [facility].  Return to the ER for new or worsening symptoms including but not limited to increased pain, increased trouble breathing,  passing out, coughing up blood, fever, change in color of your leg, worsening pain in your leg, numbness in your leg weakness in your leg, or any other concerns that you may have.    Please also see attached handout in regards to Eliquis.  If you start to have uncontrollable bleeding, you hit your head, your blood in your stool, no blood in your urine, you have any other concerns return to the emergency department immediately.

## 2019-10-07 ENCOUNTER — Emergency Department (HOSPITAL_COMMUNITY): Payer: No Typology Code available for payment source

## 2019-10-07 ENCOUNTER — Emergency Department (HOSPITAL_COMMUNITY)
Admission: EM | Admit: 2019-10-07 | Discharge: 2019-10-07 | Disposition: A | Discharge status: Home / Self Care | Payer: No Typology Code available for payment source | Attending: Emergency Medicine | Admitting: Emergency Medicine

## 2019-10-07 ENCOUNTER — Encounter (HOSPITAL_COMMUNITY): Payer: Self-pay

## 2019-10-07 DIAGNOSIS — F17290 Nicotine dependence, other tobacco product, uncomplicated: Secondary | ICD-10-CM | POA: Diagnosis not present

## 2019-10-07 DIAGNOSIS — R109 Unspecified abdominal pain: Secondary | ICD-10-CM | POA: Diagnosis present

## 2019-10-07 DIAGNOSIS — R0789 Other chest pain: Secondary | ICD-10-CM | POA: Insufficient documentation

## 2019-10-07 DIAGNOSIS — M549 Dorsalgia, unspecified: Secondary | ICD-10-CM | POA: Diagnosis not present

## 2019-10-07 DIAGNOSIS — M62838 Other muscle spasm: Secondary | ICD-10-CM | POA: Diagnosis not present

## 2019-10-07 DIAGNOSIS — M25511 Pain in right shoulder: Secondary | ICD-10-CM | POA: Insufficient documentation

## 2019-10-07 MED ORDER — DIAZEPAM 5 MG PO TABS: 5.0000 mg | ORAL_TABLET | Freq: Two times a day (BID) | ORAL | 0 refills | Status: DC | PRN

## 2019-10-07 MED ORDER — DIAZEPAM 5 MG PO TABS
5.0000 mg | ORAL_TABLET | Freq: Once | ORAL | Status: AC
  Administered 2019-10-07: 5 mg via ORAL
  Filled 2019-10-07: qty 1

## 2019-10-07 MED ORDER — KETOROLAC TROMETHAMINE 30 MG/ML IJ SOLN
30.0000 mg | Freq: Once | INTRAMUSCULAR | Status: AC
  Administered 2019-10-07: 30 mg via INTRAMUSCULAR
  Filled 2019-10-07: qty 1

## 2019-10-07 NOTE — Discharge Instructions (Addendum)
You were seen again today for atypical chest pain.  This is likely related to some muscle spasm.  Take Valium as needed for spasm.  Do not combine this with Robaxin.  While you are waiting to reinitiate your Eliquis, you may take anti-inflammatory such as ibuprofen or naproxen for your discomfort as well.  When you reinitiate Eliquis, stop taking these.  It is very important that you reinitiate your Eliquis as prescribed yesterday.

## 2019-10-07 NOTE — Progress Notes (Addendum)
@   10 am on 10/07/2019  TOC CSW received a call from [employer] on [address].  Pts insurance will not cover Eliquis.  Pt is in need of a trial card or substitute medication.  CSW spoke with Camellia, CM/RN about pts situation and she will fax a trial card to assist pt with medication costs.  [facility] fax number:  [phone]  Kapono Luhn Tarpley-Carter, MSW, LCSW-A Pronouns:  She, Her, Hers                  [facility] ED Transitions of CareClinical Social Worker [email or web address] [phone]

## 2019-10-07 NOTE — ED Provider Notes (Signed)
[facility]-EMERGENCY DEPT Provider Note   CSN: [id number] Arrival date & time: 10/07/19  0020     History No chief complaint on file.   Kathleen Bridges is a 24 y.o. female.  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 Patient reports she has had pain" spasm" since Saturday. She was seen and evaluated yesterday. At that time she was given Robaxin. She had a negative CT scan to rule out PE but was given a prescription for her Eliquis which she should be taking. She states she picked up the Robaxin but had difficulty getting the Eliquis filled because of insurance issues. She took 1 Robaxin yesterday but continues to describe "spasms" over the right flank that radiates into the right back and right shoulder. At times she reports the pain is 10 out of 10. She states that it takes her breath away. Denies any overlying skin changes or rash. Denies recent cough, fevers, upper respiratory symptoms.  Chart reviewed from yesterday. CTA negative for obvious significant PE. It was limited secondary to body habitus. Additionally, basic lab work and troponin negative.  Past Medical History:  Diagnosis Date  . Headache(784.0)   . Prediabetes     There are no problems to display for this patient.   Past Surgical History:  Procedure Laterality Date  . NO PAST SURGERIES       OB History    Gravida  0   Para      Term      Preterm      AB      Living        SAB      TAB      Ectopic      Multiple      Live Births              History reviewed. No pertinent family history.  Social History   Tobacco Use  . Smoking status: Current Some Day Smoker    Types: Cigars  . Smokeless tobacco: Never Used  Substance Use Topics  . Alcohol use: No  . Drug use: No    Home Medications Prior to Admission medications   Medication Sig  Start Date End Date Taking? Authorizing Provider  Acetaminophen (TYLENOL 8 HOUR PO) Take 2 tablets by mouth daily as needed (pain).    [provider]  ALPRAZolam Prudy Feeler) 0.25 MG tablet Take 1 tablet by mouth daily as needed.    [provider]  apixaban (ELIQUIS) 5 MG TABS tablet Take 2 tablets (10mg ) twice daily for 7 days, then 1 tablet (5mg ) twice daily 10/06/19   Petrucelli, Samantha R, PA-C  diazepam (VALIUM) 5 MG tablet Take 1 tablet (5 mg total) by mouth every 12 (twelve) hours as needed for muscle spasms. 10/07/19   Karne Ozga, 12/06/19, MD  levothyroxine (SYNTHROID) 75 MCG tablet Take 1 tablet by mouth daily.    [provider]  methocarbamol (ROBAXIN) 500 MG tablet Take 1 tablet (500 mg total) by mouth every 8 (eight) hours as needed for muscle spasms. 10/06/19   Petrucelli, Samantha R, PA-C  norgestimate-ethinyl estradiol (ORTHO-CYCLEN,SPRINTEC,PREVIFEM) 0.25-35 MG-MCG tablet Take 1 tablet by mouth daily. 08/31/13 10/06/19  Leftwich-Kirby, 09/02/13, CNM    Allergies    Patient has no known allergies.  Review of Systems   Review of Systems  Constitutional: Negative for  fever.  Respiratory: Negative for shortness of breath.   Cardiovascular: Positive for chest pain.  Gastrointestinal: Negative for abdominal pain, nausea and vomiting.  Genitourinary: Positive for flank pain. Negative for dysuria.  All other systems reviewed and are negative.   Physical Exam Updated Vital Signs BP 129/79   Pulse 95   Temp 99.4 F (37.4 C) (Oral)   Resp (!) 25   Ht 1.829 m (6')   Wt (!) 191 kg   LMP 10/06/2019   SpO2 100%   BMI 57.10 kg/m   Physical Exam Vitals and nursing note reviewed.  Constitutional:      Appearance: She is well-developed.     Comments: Morbidly obese, uncomfortable appearing but nontoxic  HENT:     Head: Normocephalic and atraumatic.     Nose: Nose normal.     Mouth/Throat:     Mouth: Mucous membranes are moist.  Eyes:     Pupils: Pupils  are equal, round, and reactive to light.  Cardiovascular:     Rate and Rhythm: Normal rate and regular rhythm.     Heart sounds: Normal heart sounds.     Comments: Exam limited by body habitus, tenderness palpation of the right chest wall, no overlying skin changes or crepitus Pulmonary:     Effort: Pulmonary effort is normal. No respiratory distress.     Breath sounds: No wheezing.  Abdominal:     General: Bowel sounds are normal.     Palpations: Abdomen is soft.     Tenderness: There is no abdominal tenderness.  Musculoskeletal:        General: No tenderness.     Cervical back: Neck supple.     Right lower leg: No edema.     Left lower leg: No edema.  Skin:    General: Skin is warm and dry.  Neurological:     Mental Status: She is alert and oriented to person, place, and time.  Psychiatric:        Mood and Affect: Mood normal.     ED Results / Procedures / Treatments   Labs (all labs ordered are listed, but only abnormal results are displayed) Labs Reviewed - No data to display  EKG EKG Interpretation  Date/Time:  Wednesday October 07 2019 01:35:31 EDT Ventricular Rate:  98 PR Interval:    QRS Duration: 94 QT Interval:  333 QTC Calculation: 426 R Axis:   88 Text Interpretation: Sinus rhythm RAE, consider biatrial enlargement No significant change since last tracing Confirmed by Ross Marcus ([id number]) on 10/07/2019 1:56:31 AM   Radiology DG Chest 2 View  Result Date: 10/07/2019 CLINICAL DATA:  Chest pain. EXAM: CHEST - 2 VIEW COMPARISON:  CT chest FINDINGS: The lung volumes are low. Bibasilar atelectasis is noted. There is no pneumothorax or large pleural effusion. The heart size appears stable. IMPRESSION: Low lung volumes with bibasilar atelectasis. Electronically Signed   By: Katherine Mantle M.D.   On: 10/07/2019 01:50   CT Angio Chest PE W/Cm &/Or Wo Cm  Result Date: 10/06/2019 CLINICAL DATA:  Pulmonary embolism suspected, high probability. EXAM: CT  ANGIOGRAPHY CHEST WITH CONTRAST TECHNIQUE: Multidetector CT imaging of the chest was performed using the standard protocol during bolus administration of intravenous contrast. Multiplanar CT image reconstructions and MIPs were obtained to evaluate the vascular anatomy. CONTRAST:  OMNIPAQUE IOHEXOL 350 MG/ML SOLN COMPARISON:  None. FINDINGS: Cardiovascular: Soft tissue attenuation significantly limits visualization of the pulmonary arteries. No evidence of pulmonary embolism. Normal appearance of the  aorta. Normal heart size with no pericardial effusion. Mediastinum/Nodes: Negative for adenopathy, mass, or pneumomediastinum. Lungs/Pleura: Low volume chest. There is no edema, consolidation, effusion, or pneumothorax. Upper Abdomen: Negative Musculoskeletal: Negative Review of the MIP images confirms the above findings. IMPRESSION: Negative, but notably limited, chest CTA. Electronically Signed   By: Marnee Spring M.D.   On: 10/06/2019 06:06    Procedures Procedures (including critical care time)  Medications Ordered in ED Medications  diazepam (VALIUM) tablet 5 mg (5 mg Oral Given 10/07/19 0154)  ketorolac (TORADOL) 30 MG/ML injection 30 mg (30 mg Intramuscular Given 10/07/19 0154)    ED Course  I have reviewed the triage vital signs and the nursing notes.  Pertinent labs & imaging results that were available during my care of the patient were reviewed by me and considered in my medical decision making (see chart for details).    MDM Rules/Calculators/A&P                          Patient presents with atypical chest and flank pain.  Was seen and evaluated yesterday for the same.  At that time had a reassuring EKG, CTA of the chest, and basic lab work including troponin that was negative.  She reports continued "spasm."  Denies GI symptoms or upper respiratory symptoms.  No overlying skin changes to suggest shingles.  EKG today is unchanged without arrhythmia or ischemia.  Low suspicion for  ACS.  Chest x-ray without pneumothorax or pneumonia.  Patient was given a dose of Toradol and Valium for muscle spasm.  On recheck, she is comfortable and pain is significantly improved.  I discussed with her that is still very important that she reinitiate her Eliquis.  I have low suspicion today for PE even though her study was limited yesterday.  While she is working with her insurance to get Eliquis approved, she may take anti-inflammatory medication such as ibuprofen or naproxen.  We will transition her to a low-dose of Valium as needed for muscle spasm as that seemed to work better for her today.  She had Xanax listed in her medication; however, no recent prescriptions in the [facility]  After history, exam, and medical workup I feel the patient has been appropriately medically screened and is safe for discharge home. Pertinent diagnoses were discussed with the patient. Patient was given return precautions.  Final Clinical Impression(s) / ED Diagnoses Final diagnoses:  Atypical chest pain  Muscle spasm    Rx / DC Orders ED Discharge Orders         Ordered    diazepam (VALIUM) 5 MG tablet  Every 12 hours PRN        10/07/19 0249           Shon Baton, MD 10/07/19 [phone]

## 2019-10-07 NOTE — ED Triage Notes (Signed)
Pt complains of pain from her right ribs to her back since Saturday, she has been seen here this week for the same and received robaxin with no relief

## 2019-10-07 NOTE — ED Notes (Signed)
Patient here with c/o right sided rib pain that goes to her back.  Patient was here last night for the same.

## 2019-10-12 DIAGNOSIS — R000 Tachycardia, unspecified: Secondary | ICD-10-CM

## 2019-10-12 HISTORY — DX: Tachycardia, unspecified: R00.0

## 2019-10-19 DIAGNOSIS — R252 Cramp and spasm: Secondary | ICD-10-CM | POA: Insufficient documentation

## 2021-01-03 ENCOUNTER — Telehealth: Payer: Self-pay

## 2021-01-03 NOTE — Telephone Encounter (Signed)
Left vm and sent mychart message to confirm 01/05/23appointment-Toni °

## 2021-01-05 ENCOUNTER — Encounter: Payer: Self-pay | Admitting: Physician Assistant

## 2021-01-05 ENCOUNTER — Other Ambulatory Visit: Payer: Self-pay

## 2021-01-05 ENCOUNTER — Ambulatory Visit: Payer: No Typology Code available for payment source | Admitting: Physician Assistant

## 2021-01-05 DIAGNOSIS — E039 Hypothyroidism, unspecified: Secondary | ICD-10-CM

## 2021-01-05 DIAGNOSIS — Z86718 Personal history of other venous thrombosis and embolism: Secondary | ICD-10-CM

## 2021-01-05 DIAGNOSIS — E559 Vitamin D deficiency, unspecified: Secondary | ICD-10-CM

## 2021-01-05 DIAGNOSIS — R5383 Other fatigue: Secondary | ICD-10-CM

## 2021-01-05 DIAGNOSIS — R030 Elevated blood-pressure reading, without diagnosis of hypertension: Secondary | ICD-10-CM

## 2021-01-05 DIAGNOSIS — Z7689 Persons encountering health services in other specified circumstances: Secondary | ICD-10-CM

## 2021-01-05 DIAGNOSIS — F411 Generalized anxiety disorder: Secondary | ICD-10-CM | POA: Diagnosis not present

## 2021-01-05 MED ORDER — ESCITALOPRAM OXALATE 5 MG PO TABS: 5.0000 mg | ORAL_TABLET | Freq: Every day | ORAL | 2 refills | Status: DC

## 2021-01-05 MED ORDER — APIXABAN 5 MG PO TABS: ORAL_TABLET | ORAL | 0 refills | Status: DC

## 2021-01-05 NOTE — Progress Notes (Signed)
[facility] [address] [place], [place] [address]  Internal MEDICINE  Office Visit Note  Patient Name: Kathleen Bridges  [id number]  [id number]  Date of Service: 01/06/2021   Complaints/HPI Pt is here for establishment of PCP. Chief Complaint  Patient presents with   New Patient (Initial Visit)   HPI Moved here a few years ago from [place], 2019 -Has not been established with PCP here. Did have PNA, after covid vaccine. -hx of taking birth control which led to DVT and was placed on eliquis, but was then found to be anemic after and had to do iron infusions -Reports with moving up here she stopped her medications including the eliquis -Did have a few ED visits in 2021 including right leg pain which was negative for DVT this time and was restarted on eliquis, but did not establish with PCP after and therefore did not continue again. She also had ED visit for CP in 2021 as well and a CTA was done and was negative for PE. Also had negative troponins and was discharged thinking it might be MSK related with reproducible chest wall tenderness and was given muscle relaxer. She went back to ED the following day with continued atypical CP and flank pain and was given toradol and valium which improved symptoms and she was discharged -Reports a hx of her BP rising which may have been due to anxiety, but was never dx with HTN or started on BP meds. BP is elevated in office today but likely due to her anxiety associated with doctor's offices. -Does feel anxious all the time. -Also states she has some headaches and left hip pain when sitting in the car after long work night.  -Works in a warehouse: M,T,Thr, F at night. Normally would be sleeping at this time.  -occasionally drinks alcohol, previous smoker--2014-15, not currently smoking, does admit to sometimes using CBD gummies.  -Lives by herself with 2 cats  Current Medication: Outpatient Encounter Medications as of 01/05/2021   Medication Sig Note   Acetaminophen (TYLENOL 8 HOUR PO) Take 2 tablets by mouth daily as needed (pain).    ALPRAZolam (XANAX) 0.25 MG tablet Take 1 tablet by mouth daily as needed.    apixaban (ELIQUIS) 5 MG TABS tablet Take 2 tablets (10mg ) twice daily    escitalopram (LEXAPRO) 5 MG tablet Take 1 tablet (5 mg total) by mouth daily.    levothyroxine (SYNTHROID) 75 MCG tablet Take 1 tablet by mouth daily. 10/06/2019: Not taking Needs PCP Out of Refills    [DISCONTINUED] apixaban (ELIQUIS) 5 MG TABS tablet Take 2 tablets (10mg ) twice daily for 7 days, then 1 tablet (5mg ) twice daily    diazepam (VALIUM) 5 MG tablet Take 1 tablet (5 mg total) by mouth every 12 (twelve) hours as needed for muscle spasms. (Patient not taking: Reported on 01/05/2021)    methocarbamol (ROBAXIN) 500 MG tablet Take 1 tablet (500 mg total) by mouth every 8 (eight) hours as needed for muscle spasms. (Patient not taking: Reported on 01/05/2021)    [DISCONTINUED] norgestimate-ethinyl estradiol (ORTHO-CYCLEN,SPRINTEC,PREVIFEM) 0.25-35 MG-MCG tablet Take 1 tablet by mouth daily.    No facility-administered encounter medications on file as of 01/05/2021.    Surgical History: Past Surgical History:  Procedure Laterality Date   NO PAST SURGERIES      Medical History: Past Medical History:  Diagnosis Date   Anemia    Headache(784.0)    PCOS (polycystic ovarian syndrome)    Prediabetes  Family History: Family History  Problem Relation Age of Onset   Hashimoto's thyroiditis Mother    Hypertension Father    Diabetes Maternal Grandmother    Hypertension Maternal Grandfather     Social History   Socioeconomic History   Marital status: Single    Spouse name: Not on file   Number of children: Not on file   Years of education: Not on file   Highest education level: Not on file  Occupational History   Not on file  Tobacco Use   Smoking status: Some Days    Types: Cigars   Smokeless tobacco: Never  Substance and  Sexual Activity   Alcohol use: Yes   Drug use: No   Sexual activity: Yes    Birth control/protection: Abstinence  Other Topics Concern   Not on file  Social History Narrative   Not on file   Social Determinants of Health   Financial Resource Strain: Not on file  Food Insecurity: Not on file  Transportation Needs: Not on file  Physical Activity: Not on file  Stress: Not on file  Social Connections: Not on file  Intimate Partner Violence: Not on file     Review of Systems  Constitutional:  Negative for chills, fatigue and unexpected weight change.  HENT:  Negative for congestion, postnasal drip, rhinorrhea, sneezing and sore throat.   Eyes:  Negative for redness.  Respiratory:  Negative for cough, chest tightness and shortness of breath.   Cardiovascular:  Negative for chest pain and palpitations.  Gastrointestinal:  Negative for abdominal pain, constipation, diarrhea, nausea and vomiting.  Genitourinary:  Negative for dysuria and frequency.  Musculoskeletal:  Positive for arthralgias. Negative for back pain, joint swelling and neck pain.  Skin:  Negative for rash.  Neurological:  Positive for headaches. Negative for tremors and numbness.  Hematological:  Negative for adenopathy. Does not bruise/bleed easily.  Psychiatric/Behavioral:  Negative for behavioral problems (Depression), sleep disturbance and suicidal ideas. The patient is nervous/anxious.    Vital Signs: BP (!) 150/95    Pulse 95    Resp 16    Ht 6' (1.829 m)    Wt (!) 413 lb 6.4 oz (187.5 kg)    SpO2 99%    BMI 56.07 kg/m    Physical Exam Vitals and nursing note reviewed.  Constitutional:      General: She is not in acute distress.    Appearance: She is well-developed. She is obese. She is not diaphoretic.  HENT:     Head: Normocephalic and atraumatic.     Mouth/Throat:     Pharynx: No oropharyngeal exudate.  Eyes:     Pupils: Pupils are equal, round, and reactive to light.  Neck:     Thyroid: No  thyromegaly.     Vascular: No JVD.     Trachea: No tracheal deviation.  Cardiovascular:     Rate and Rhythm: Normal rate and regular rhythm.     Heart sounds: Normal heart sounds. No murmur heard.   No friction rub. No gallop.  Pulmonary:     Effort: Pulmonary effort is normal. No respiratory distress.     Breath sounds: No wheezing or rales.  Chest:     Chest wall: No tenderness.  Abdominal:     General: Bowel sounds are normal.     Palpations: Abdomen is soft.  Musculoskeletal:        General: Normal range of motion.     Cervical back: Normal range of motion and neck  supple.  Lymphadenopathy:     Cervical: No cervical adenopathy.  Skin:    General: Skin is warm and dry.  Neurological:     Mental Status: She is alert and oriented to person, place, and time.     Cranial Nerves: No cranial nerve deficit.  Psychiatric:        Thought Content: Thought content normal.        Judgment: Judgment normal.     Comments: Pt is anxious and tearful in room      Assessment/Plan: 1. GAD (generalized anxiety disorder) Will start 5 mg lexapro and discussed we will likely need to titrate up - escitalopram (LEXAPRO) 5 MG tablet; Take 1 tablet (5 mg total) by mouth daily.  Dispense: 30 tablet; Refill: 2  2. Elevated BP without diagnosis of hypertension Will obtain BP cuff and log BP at home. May improve with control of anxiety, but if still elevated or increased headaches may need to initiate BP med  3. History of DVT (deep vein thrombosis) Will restart on eliquis - apixaban (ELIQUIS) 5 MG TABS tablet; Take 2 tablets (10mg ) twice daily  Dispense: 60 tablet; Refill: 0  4. Hypothyroidism, unspecified type Previously on thyroid medication, but stopped many years ago. Will recheck labs and restart med if indicated - TSH + free T4  5. Other fatigue - CBC w/Diff/Platelet - Comprehensive metabolic panel - Lipid Panel With LDL/HDL Ratio - Fe+TIBC+Fer  6. Vitamin D deficiency - VITAMIN D  25 Hydroxy (Vit-D Deficiency, Fractures)  7. Encounter to establish care with new doctor Reviewed medical hx and ordered routine fasting labs for CPE with pap next visit   General Counseling: Enrique SackKendra verbalizes understanding of the findings of todays visit and agrees with plan of treatment. I have discussed any further diagnostic evaluation that may be needed or ordered today. We also reviewed her medications today. she has been encouraged to call the office with any questions or concerns that should arise related to todays visit.    Counseling:    Orders Placed This Encounter  Procedures   CBC w/Diff/Platelet   Comprehensive metabolic panel   TSH + free T4   Lipid Panel With LDL/HDL Ratio   VITAMIN D 25 Hydroxy (Vit-D Deficiency, Fractures)   Fe+TIBC+Fer    Meds ordered this encounter  Medications   escitalopram (LEXAPRO) 5 MG tablet    Sig: Take 1 tablet (5 mg total) by mouth daily.    Dispense:  30 tablet    Refill:  2   apixaban (ELIQUIS) 5 MG TABS tablet    Sig: Take 2 tablets (10mg ) twice daily    Dispense:  60 tablet    Refill:  0     This patient was seen by Lynn ItoLauren Omer Monter, PA-C in collaboration with Dr. Beverely RisenFozia Khan as a part of collaborative care agreement.   Time spent:40 Minutes

## 2021-01-08 ENCOUNTER — Telehealth: Payer: Self-pay

## 2021-01-08 DIAGNOSIS — Z86718 Personal history of other venous thrombosis and embolism: Secondary | ICD-10-CM

## 2021-01-08 MED ORDER — APIXABAN 5 MG PO TABS: ORAL_TABLET | ORAL | 1 refills | Status: DC

## 2021-01-08 NOTE — Telephone Encounter (Signed)
PA sent for ELIQUIS 5 mg Tablets 01/08/21 at 939 pm and came back approved valid from 12/09/20 to 01/08/2022.  Sent new prescription to pharmacy

## 2021-02-16 ENCOUNTER — Other Ambulatory Visit: Payer: No Typology Code available for payment source | Admitting: Physician Assistant

## 2021-03-09 LAB — CBC WITH DIFFERENTIAL/PLATELET
Basophils Absolute: 0 10*3/uL (ref 0.0–0.2)
Basos: 1 %
EOS (ABSOLUTE): 0.2 10*3/uL (ref 0.0–0.4)
Eos: 3 %
Hematocrit: 39.2 % (ref 34.0–46.6)
Hemoglobin: 11.8 g/dL (ref 11.1–15.9)
Immature Grans (Abs): 0 10*3/uL (ref 0.0–0.1)
Immature Granulocytes: 0 %
Lymphocytes Absolute: 3 10*3/uL (ref 0.7–3.1)
Lymphs: 47 %
MCH: 19.1 pg — ABNORMAL LOW (ref 26.6–33.0)
MCHC: 30.1 g/dL — ABNORMAL LOW (ref 31.5–35.7)
MCV: 63 fL — ABNORMAL LOW (ref 79–97)
Monocytes Absolute: 0.4 10*3/uL (ref 0.1–0.9)
Monocytes: 7 %
Neutrophils Absolute: 2.7 10*3/uL (ref 1.4–7.0)
Neutrophils: 42 %
Platelets: 292 10*3/uL (ref 150–450)
RBC: 6.18 x10E6/uL — ABNORMAL HIGH (ref 3.77–5.28)
RDW: 20.2 % — ABNORMAL HIGH (ref 11.7–15.4)
WBC: 6.3 10*3/uL (ref 3.4–10.8)

## 2021-03-09 LAB — COMPREHENSIVE METABOLIC PANEL
ALT: 16 IU/L (ref 0–32)
AST: 15 IU/L (ref 0–40)
Albumin/Globulin Ratio: 1.4 (ref 1.2–2.2)
Albumin: 4.6 g/dL (ref 3.9–5.0)
Alkaline Phosphatase: 67 IU/L (ref 44–121)
BUN/Creatinine Ratio: 17 (ref 9–23)
BUN: 13 mg/dL (ref 6–20)
Bilirubin Total: 0.5 mg/dL (ref 0.0–1.2)
CO2: 22 mmol/L (ref 20–29)
Calcium: 9.5 mg/dL (ref 8.7–10.2)
Chloride: 103 mmol/L (ref 96–106)
Creatinine, Ser: 0.78 mg/dL (ref 0.57–1.00)
Globulin, Total: 3.3 g/dL (ref 1.5–4.5)
Glucose: 81 mg/dL (ref 70–99)
Potassium: 4.2 mmol/L (ref 3.5–5.2)
Sodium: 140 mmol/L (ref 134–144)
Total Protein: 7.9 g/dL (ref 6.0–8.5)
eGFR: 107 mL/min/{1.73_m2} (ref >59)

## 2021-03-09 LAB — LIPID PANEL WITH LDL/HDL RATIO
Cholesterol, Total: 176 mg/dL (ref 100–199)
HDL: 79 mg/dL (ref >39)
LDL Chol Calc (NIH): 84 mg/dL (ref 0–99)
LDL/HDL Ratio: 1.1 ratio (ref 0.0–3.2)
Triglycerides: 69 mg/dL (ref 0–149)
VLDL Cholesterol Cal: 13 mg/dL (ref 5–40)

## 2021-03-09 LAB — VITAMIN D 25 HYDROXY (VIT D DEFICIENCY, FRACTURES): Vit D, 25-Hydroxy: 10.7 ng/mL — ABNORMAL LOW (ref 30.0–100.0)

## 2021-03-09 LAB — TSH+FREE T4
Free T4: 1.04 ng/dL (ref 0.82–1.77)
TSH: 7.26 u[IU]/mL — ABNORMAL HIGH (ref 0.450–4.500)

## 2021-03-09 LAB — IRON,TIBC AND FERRITIN PANEL
Ferritin: 46 ng/mL (ref 15–150)
Iron Saturation: 11 % — ABNORMAL LOW (ref 15–55)
Iron: 35 ug/dL (ref 27–159)
Total Iron Binding Capacity: 331 ug/dL (ref 250–450)
UIBC: 296 ug/dL (ref 131–425)

## 2021-03-14 ENCOUNTER — Other Ambulatory Visit: Payer: Self-pay

## 2021-03-14 ENCOUNTER — Other Ambulatory Visit: Payer: Self-pay | Admitting: Physician Assistant

## 2021-03-14 DIAGNOSIS — E559 Vitamin D deficiency, unspecified: Secondary | ICD-10-CM

## 2021-03-14 DIAGNOSIS — Z86718 Personal history of other venous thrombosis and embolism: Secondary | ICD-10-CM

## 2021-03-14 MED ORDER — ERGOCALCIFEROL 1.25 MG (50000 UT) PO CAPS: ORAL_CAPSULE | ORAL | 3 refills | Status: DC

## 2021-03-14 MED ORDER — APIXABAN 5 MG PO TABS: 5.0000 mg | ORAL_TABLET | Freq: Two times a day (BID) | ORAL | 2 refills | Status: DC

## 2021-03-14 MED ORDER — ERGOCALCIFEROL 1.25 MG (50000 UT) PO CAPS: ORAL_CAPSULE | ORAL | 3 refills | Status: AC

## 2021-03-14 NOTE — Telephone Encounter (Signed)
Called and spoke to pt and informed her about labs per Leotis Shames pt was  informed: ? ?RBC is elevated which can be a sign of possible OSA which we can discuss at her visit, her vit D is low and should start on drisdol weekly, also her iron is low and should supplement daily. Please also advise her to take only 5mg  Eliquis twice daily now that she has reinitiated therapy.  ?Also informed pt that prescriptions were sent to pharmacy for drisdol and eliquis ?

## 2021-03-30 ENCOUNTER — Encounter: Payer: Self-pay | Admitting: Physician Assistant

## 2021-03-30 ENCOUNTER — Telehealth: Payer: Self-pay

## 2021-03-30 ENCOUNTER — Ambulatory Visit (INDEPENDENT_AMBULATORY_CARE_PROVIDER_SITE_OTHER): Payer: No Typology Code available for payment source | Admitting: Physician Assistant

## 2021-03-30 DIAGNOSIS — R030 Elevated blood-pressure reading, without diagnosis of hypertension: Secondary | ICD-10-CM | POA: Diagnosis not present

## 2021-03-30 DIAGNOSIS — R300 Dysuria: Secondary | ICD-10-CM

## 2021-03-30 DIAGNOSIS — Z86718 Personal history of other venous thrombosis and embolism: Secondary | ICD-10-CM | POA: Diagnosis not present

## 2021-03-30 DIAGNOSIS — E039 Hypothyroidism, unspecified: Secondary | ICD-10-CM

## 2021-03-30 DIAGNOSIS — F411 Generalized anxiety disorder: Secondary | ICD-10-CM

## 2021-03-30 DIAGNOSIS — G4710 Hypersomnia, unspecified: Secondary | ICD-10-CM

## 2021-03-30 DIAGNOSIS — Z0001 Encounter for general adult medical examination with abnormal findings: Secondary | ICD-10-CM | POA: Diagnosis not present

## 2021-03-30 DIAGNOSIS — Z01419 Encounter for gynecological examination (general) (routine) without abnormal findings: Secondary | ICD-10-CM

## 2021-03-30 DIAGNOSIS — Z124 Encounter for screening for malignant neoplasm of cervix: Secondary | ICD-10-CM

## 2021-03-30 DIAGNOSIS — E611 Iron deficiency: Secondary | ICD-10-CM

## 2021-03-30 DIAGNOSIS — B379 Candidiasis, unspecified: Secondary | ICD-10-CM

## 2021-03-30 DIAGNOSIS — Z113 Encounter for screening for infections with a predominantly sexual mode of transmission: Secondary | ICD-10-CM

## 2021-03-30 DIAGNOSIS — E559 Vitamin D deficiency, unspecified: Secondary | ICD-10-CM

## 2021-03-30 MED ORDER — FLUCONAZOLE 150 MG PO TABS: 150.0000 mg | ORAL_TABLET | Freq: Once | ORAL | 0 refills | Status: AC

## 2021-03-30 MED ORDER — ESCITALOPRAM OXALATE 5 MG PO TABS: 5.0000 mg | ORAL_TABLET | Freq: Every day | ORAL | 0 refills | Status: DC

## 2021-03-30 MED ORDER — LEVOTHYROXINE SODIUM 25 MCG PO TABS: 25.0000 ug | ORAL_TABLET | Freq: Every day | ORAL | 3 refills | Status: DC

## 2021-03-30 NOTE — Progress Notes (Signed)
?[facility] ?[address] ?[place], [place] [address] ? ?Internal MEDICINE  ?Office Visit Note ? ?Patient Name: Kathleen Bridges ? [id number]  ?[id number] ? ?Date of Service: 03/30/2021 ? ?Chief Complaint  ?Patient presents with  ? Annual Exam  ? Anemia  ? Urinary Tract Infection  ?  Possible UTI or Yeast Infection  ? ? ? ?HPI ?Pt is here for routine health maintenance examination ?-Has been taking eliquis 5mg  BID ?-Has been taking Lexapro most days and it seems to be helping, does notice a difference when she misses a dose hearing to medications.  In the past she has had difficulty with remembering to take medications daily and is trying to work on this.  She states that she thinks the current dose of Lexapro is working well for her and would like to remain at this dose. ?-Reviewed labs which showed low vitamin D and low iron.  Will pick up Vit D today and iron supplement. PO iron has made her sick before but will try and if not will go for infusions.  States she has had to have iron infusions in the past.  Her red blood cell count is also elevated and we did due to low oxygen levels at night largely attributed to undiagnosed OSA and will have further evaluation.  And will have further evaluation.  Additionally her TSH was elevated but free T4 was low normal.  Patient does have a history of hypothyroidism and has been off of her Synthroid for a while.  Discussed restarting on low-dose Synthroid and monitoring labs. ?-Has some vaginal itching, some urinary frequency, no burning or smell, sometimes milky discharge.  Thinks that she might be having a yeast infection.  Discussed that we will go ahead and do a single tablet of Diflucan to address yeast but will also send off culture as part of pelvic exam today and send urine to lab for further evaluation ?-Dad has OSA.  Not sure if she snores. Does take melatonin to help with  sleep. Feels groggy most days but works third shift. Goes to sleep at 8am and  wakes up 3-4:40pm.  ?-will start checking BP at home ?EPWORTH SLEEPINESS SCALE: ? ?Scale:  ?(0)= no chance of dozing; (1)= slight chance of dozing; (2)= moderate chance of dozing; (3)= high chance of dozing ? ?Chance  Situtation ?   ?Sitting and reading: 1 ?  ? Watching TV: 1 ?   ?Sitting Inactive in public: 0 ?   ?As a passenger in car: 2   ?   ?Lying down to rest: 2 ?   ?Sitting and talking: 1 ?   ?Sitting quielty after lunch: 2 ?   ?In a car, stopped in traffic: 0 ? ? ?TOTAL SCORE:   9 out of 24 ? ? ?Current Medication: ?Outpatient Encounter Medications as of 03/30/2021  ?Medication Sig Note  ? Acetaminophen (TYLENOL 8 HOUR PO) Take 2 tablets by mouth daily as needed (pain).   ? ALPRAZolam (XANAX) 0.25 MG tablet Take 1 tablet by mouth daily as needed.   ? apixaban (ELIQUIS) 5 MG TABS tablet Take 1 tablet (5 mg total) by mouth 2 (two) times daily.   ? ergocalciferol (DRISDOL) 1.25 MG (50000 UT) capsule Take one cap q week   ? fluconazole (DIFLUCAN) 150 MG tablet Take 1 tablet (150 mg total) by mouth once for 1 dose.   ? levothyroxine (SYNTHROID) 25 MCG tablet Take 1 tablet (25 mcg total) by mouth daily.   ? [  DISCONTINUED] diazepam (VALIUM) 5 MG tablet Take 1 tablet (5 mg total) by mouth every 12 (twelve) hours as needed for muscle spasms.   ? [DISCONTINUED] escitalopram (LEXAPRO) 5 MG tablet Take 1 tablet (5 mg total) by mouth daily.   ? [DISCONTINUED] levothyroxine (SYNTHROID) 75 MCG tablet Take 1 tablet by mouth daily. 10/06/2019: Not taking Needs PCP Out of Refills   ? [DISCONTINUED] methocarbamol (ROBAXIN) 500 MG tablet Take 1 tablet (500 mg total) by mouth every 8 (eight) hours as needed for muscle spasms.   ? escitalopram (LEXAPRO) 5 MG tablet Take 1 tablet (5 mg total) by mouth daily.   ? [DISCONTINUED] norgestimate-ethinyl estradiol (ORTHO-CYCLEN,SPRINTEC,PREVIFEM) 0.25-35 MG-MCG tablet Take 1 tablet by mouth daily.   ? ?No facility-administered encounter medications on file as of 03/30/2021.  ? ? ?Surgical  History: ?Past Surgical History:  ?Procedure Laterality Date  ? NO PAST SURGERIES    ? ? ?Medical History: ?Past Medical History:  ?Diagnosis Date  ? Anemia   ? Headache(784.0)   ? PCOS (polycystic ovarian syndrome)   ? Prediabetes   ? ? ?Family History: ?Family History  ?Problem Relation Age of Onset  ? Hashimoto's thyroiditis Mother   ? Hypertension Father   ? Diabetes Maternal Grandmother   ? Hypertension Maternal Grandfather   ? ? ? ? ?Review of Systems  ?Constitutional:  Negative for chills, fatigue and unexpected weight change.  ?HENT:  Negative for congestion, postnasal drip, rhinorrhea, sneezing and sore throat.   ?Eyes:  Negative for redness.  ?Respiratory:  Negative for cough, chest tightness and shortness of breath.   ?Cardiovascular:  Negative for chest pain and palpitations.  ?Gastrointestinal:  Negative for abdominal pain, constipation, diarrhea, nausea and vomiting.  ?Genitourinary:  Negative for dysuria and frequency.  ?Musculoskeletal:  Positive for arthralgias. Negative for back pain, joint swelling and neck pain.  ?Skin:  Negative for rash.  ?Neurological:  Positive for headaches. Negative for tremors and numbness.  ?Hematological:  Negative for adenopathy. Does not bruise/bleed easily.  ?Psychiatric/Behavioral:  Negative for behavioral problems (Depression), sleep disturbance and suicidal ideas. The patient is nervous/anxious.   ? ? ?Vital Signs: ?BP (!) 142/100 Comment: 144/112  Pulse 92   Temp 98.4 ?F (36.9 ?C)   Resp 16   Ht 6' (1.829 m)   Wt (!) 413 lb (187.3 kg)   SpO2 99%   BMI 56.01 kg/m?  ? ? ?Physical Exam ?Vitals and nursing note reviewed. Exam conducted with a chaperone present.  ?Constitutional:   ?   General: She is not in acute distress. ?   Appearance: She is well-developed. She is obese. She is not diaphoretic.  ?HENT:  ?   Head: Normocephalic and atraumatic.  ?   Mouth/Throat:  ?   Pharynx: No oropharyngeal exudate.  ?Eyes:  ?   Pupils: Pupils are equal, round, and  reactive to light.  ?Neck:  ?   Thyroid: No thyromegaly.  ?   Vascular: No JVD.  ?   Trachea: No tracheal deviation.  ?Cardiovascular:  ?   Rate and Rhythm: Normal rate and regular rhythm.  ?   Heart sounds: Normal heart sounds. No murmur heard. ?  No friction rub. No gallop.  ?Pulmonary:  ?   Effort: Pulmonary effort is normal. No respiratory distress.  ?   Breath sounds: No wheezing or rales.  ?Chest:  ?   Chest wall: No tenderness.  ?Breasts: ?   Right: Normal. No mass.  ?   Left: Normal. No mass.  ?  Abdominal:  ?   General: Bowel sounds are normal.  ?   Palpations: Abdomen is soft.  ?   Tenderness: There is no abdominal tenderness.  ?Genitourinary: ?   Exam position: Lithotomy position.  ?   Vagina: Vaginal discharge present.  ?   Cervix: Friability present.  ?   Comments: Pap performed ?Musculoskeletal:     ?   General: Normal range of motion.  ?   Cervical back: Normal range of motion and neck supple.  ?Lymphadenopathy:  ?   Cervical: No cervical adenopathy.  ?Skin: ?   General: Skin is warm and dry.  ?Neurological:  ?   Mental Status: She is alert and oriented to person, place, and time.  ?   Cranial Nerves: No cranial nerve deficit.  ?Psychiatric:     ?   Thought Content: Thought content normal.     ?   Judgment: Judgment normal.  ?   Comments: Pt is anxious  ? ? ? ?LABS: ?Recent Results (from the past 2160 hour(s))  ?CBC w/Diff/Platelet     Status: Abnormal  ? Collection Time: 03/08/21  8:53 AM  ?Result Value Ref Range  ? WBC 6.3 3.4 - 10.8 x10E3/uL  ? RBC 6.18 (H) 3.77 - 5.28 x10E6/uL  ? Hemoglobin 11.8 11.1 - 15.9 g/dL  ? Hematocrit 39.2 34.0 - 46.6 %  ? MCV 63 (L) 79 - 97 fL  ? MCH 19.1 (L) 26.6 - 33.0 pg  ? MCHC 30.1 (L) 31.5 - 35.7 g/dL  ? RDW 20.2 (H) 11.7 - 15.4 %  ? Platelets 292 150 - 450 x10E3/uL  ? Neutrophils 42 Not Estab. %  ? Lymphs 47 Not Estab. %  ? Monocytes 7 Not Estab. %  ? Eos 3 Not Estab. %  ? Basos 1 Not Estab. %  ? Neutrophils Absolute 2.7 1.4 - 7.0 x10E3/uL  ? Lymphocytes Absolute 3.0  0.7 - 3.1 x10E3/uL  ? Monocytes Absolute 0.4 0.1 - 0.9 x10E3/uL  ? EOS (ABSOLUTE) 0.2 0.0 - 0.4 x10E3/uL  ? Basophils Absolute 0.0 0.0 - 0.2 x10E3/uL  ? Immature Granulocytes 0 Not Estab. %  ? Immature Grans (Abs) 0.0

## 2021-03-30 NOTE — Telephone Encounter (Signed)
Home SS ordered. Printed. Gave to Titania-Toni ?

## 2021-03-31 IMAGING — CT CT ANGIO CHEST
3 of 7 series · 18 of 36 positions shown · IV contrast (OMNIPAQUE 350)
Comparison: None.

CLINICAL DATA: Pulmonary embolism suspected, high probability.

EXAM:
CT ANGIOGRAPHY CHEST WITH CONTRAST
TECHNIQUE: Multidetector CT imaging of the chest was performed using the
standard protocol during bolus administration of intravenous
contrast. Multiplanar CT image reconstructions and MIPs were
obtained to evaluate the vascular anatomy.
CONTRAST:  100mL OMNIPAQUE IOHEXOL 350 MG/ML SOLN

[Series 5: thins · axial · 0.66mm/px · z∈[-144,+58]mm · 12 of 240 slices shown]
[im 19/240  lung]
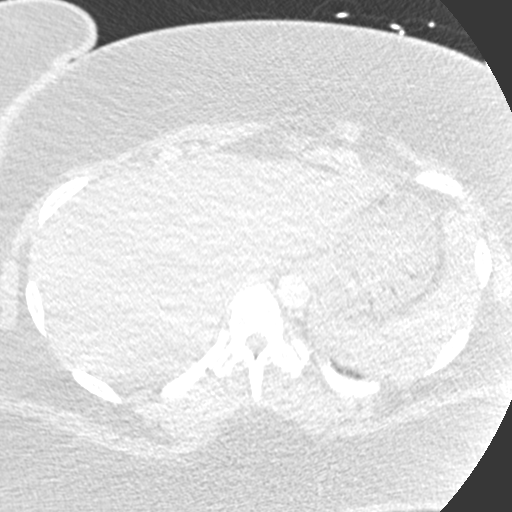
[im 37/240  mediastinal]
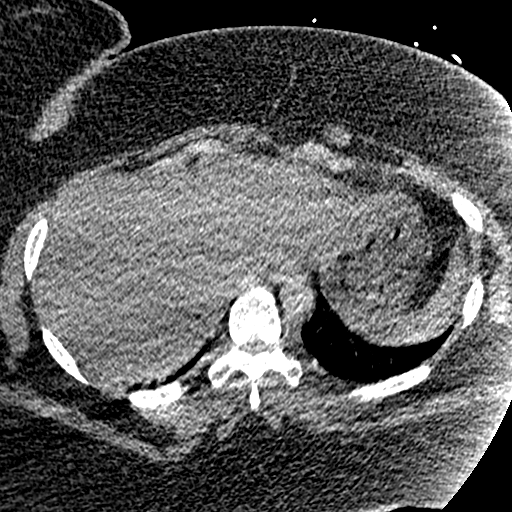
[im 56/240  lung]
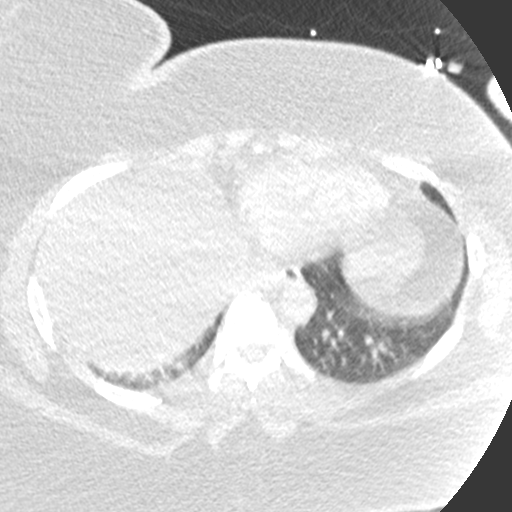
[im 74/240  mediastinal]
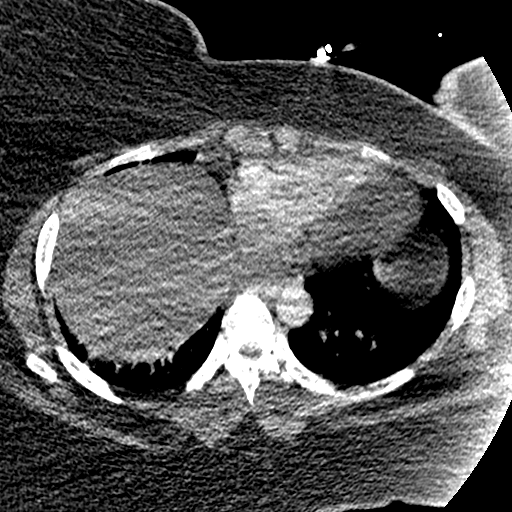
[im 92/240  lung]
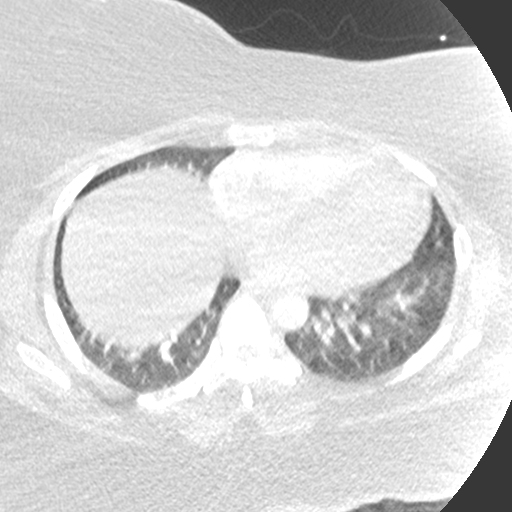
[im 111/240  mediastinal]
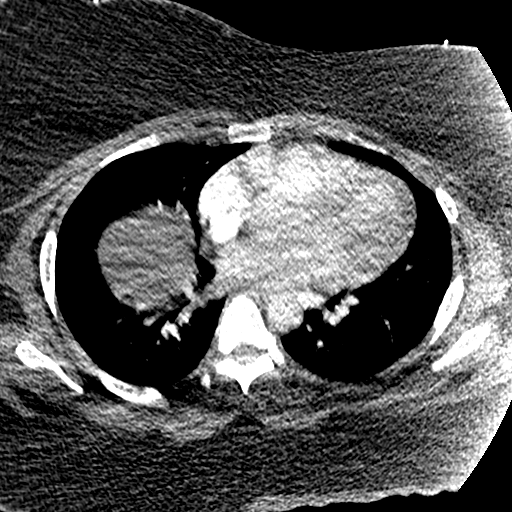
[im 129/240  lung]
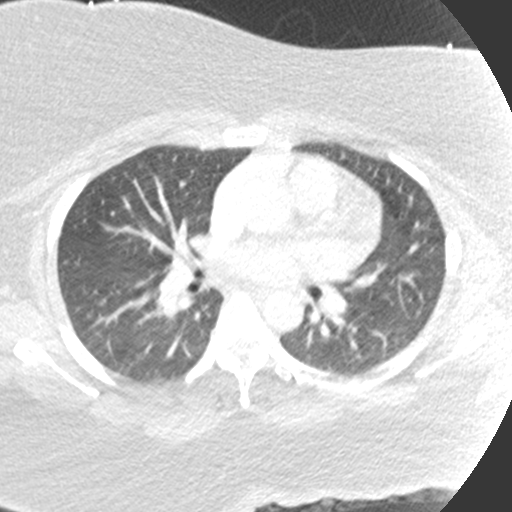
[im 148/240  mediastinal]
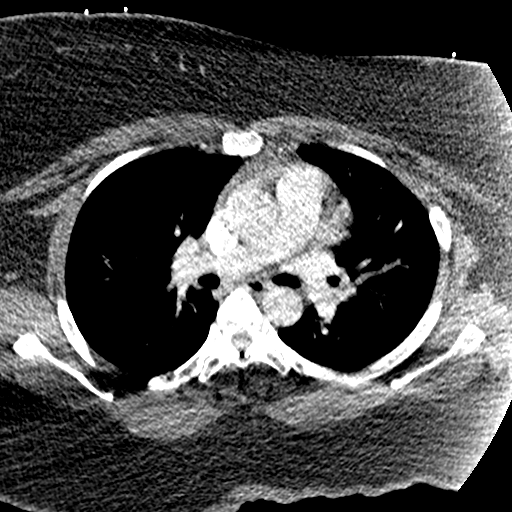
[im 166/240  lung]
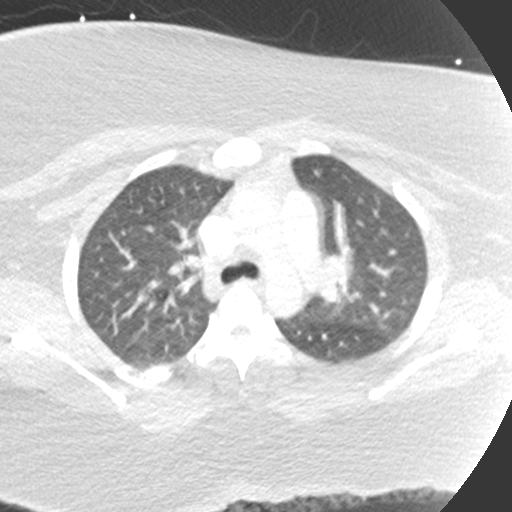
[im 184/240  mediastinal]
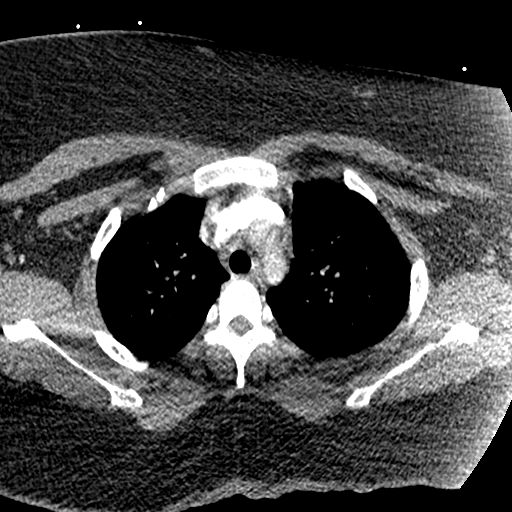
[im 203/240  lung]
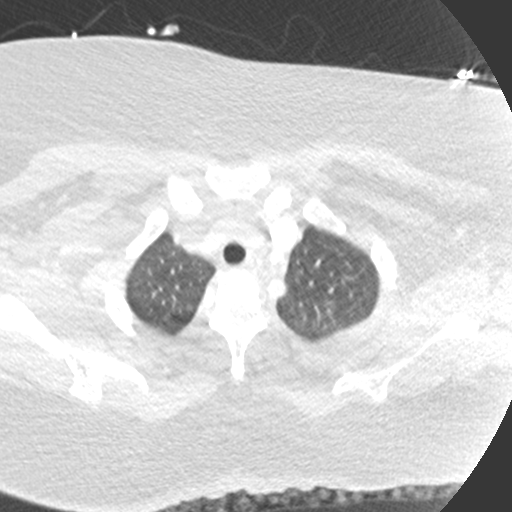
[im 221/240  mediastinal]
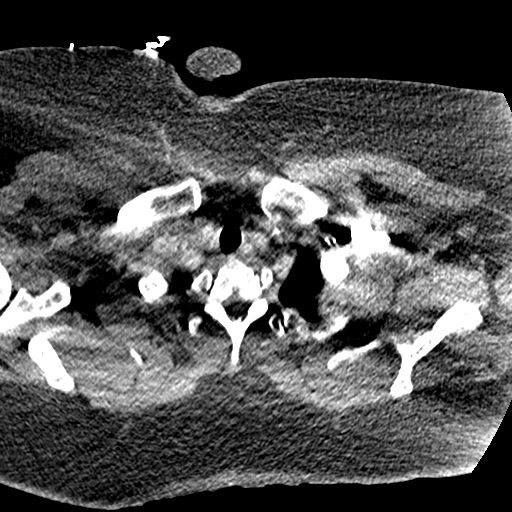

[Series 6: coronal mpr · coronal · 0.49mm/px · 1 of 151 slices shown]
[im 76/151  mediastinal]
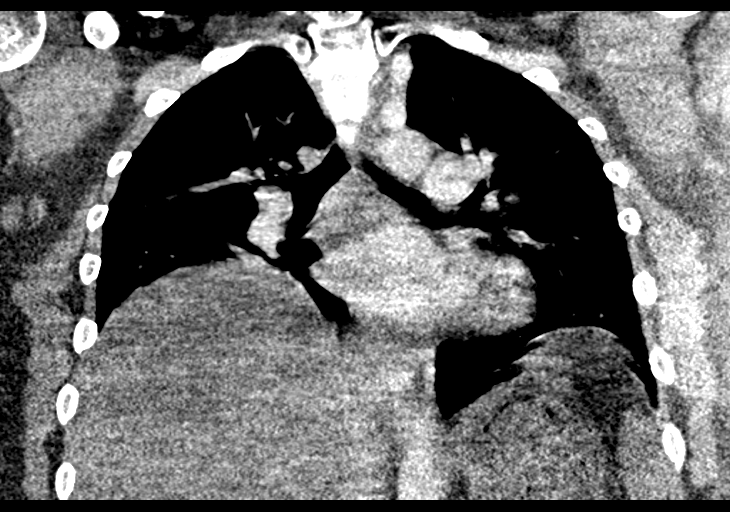

[Series 10: lung · axial · 0.66mm/px · z∈[-111,+39]mm · 5 of 113 slices shown]
[im 19/113  mediastinal]
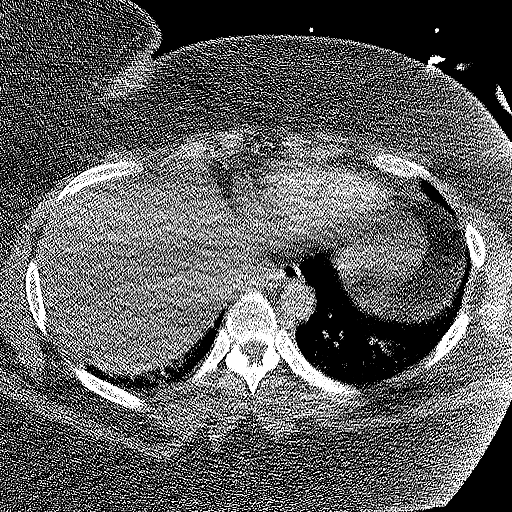
[im 38/113  mediastinal]
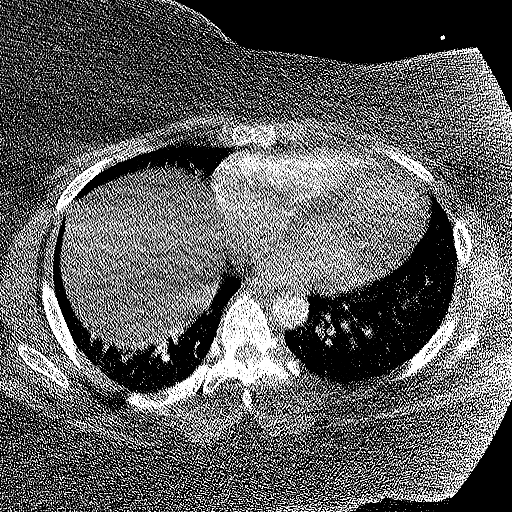
[im 57/113  mediastinal]
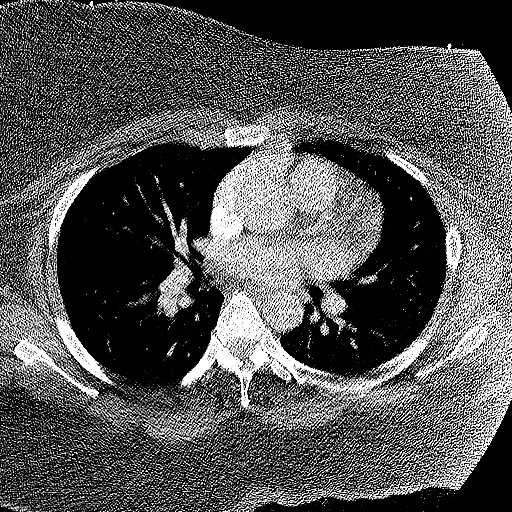
[im 75/113  mediastinal]
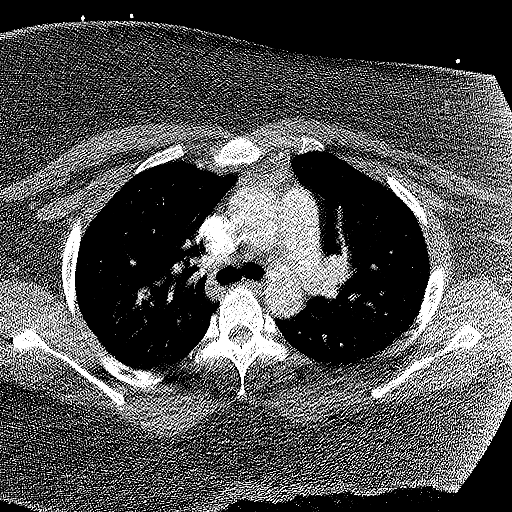
[im 94/113  mediastinal]
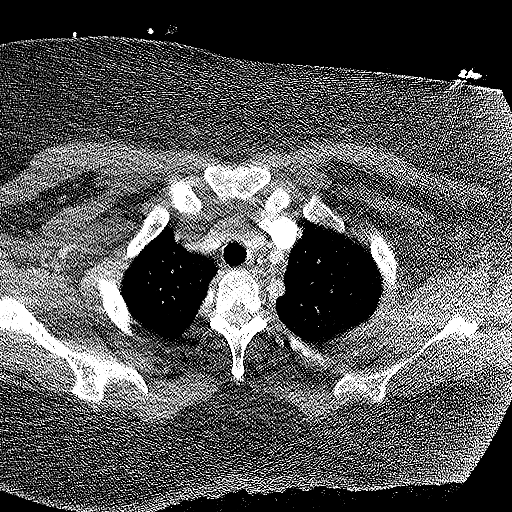

[18 of 36 positions shown; findings below may reference images not displayed]

FINDINGS: Cardiovascular: Soft tissue attenuation significantly limits
visualization of the pulmonary arteries. No evidence of pulmonary
embolism. Normal appearance of the aorta. Normal heart size with no
pericardial effusion.

Mediastinum/Nodes: Negative for adenopathy, mass, or
pneumomediastinum.

Lungs/Pleura: Low volume chest. There is no edema, consolidation,
effusion, or pneumothorax.

Upper Abdomen: Negative

Musculoskeletal: Negative

Review of the MIP images confirms the above findings.
IMPRESSION: Negative, but notably limited, chest CTA.

## 2021-04-03 LAB — URINE CULTURE, REFLEX

## 2021-04-03 LAB — UA/M W/RFLX CULTURE, ROUTINE
Bilirubin, UA: NEGATIVE
Glucose, UA: NEGATIVE
Ketones, UA: NEGATIVE
Nitrite, UA: NEGATIVE
Specific Gravity, UA: 1.027 (ref 1.005–1.030)
Urobilinogen, Ur: 1 mg/dL (ref 0.2–1.0)
pH, UA: 6.5 (ref 5.0–7.5)

## 2021-04-03 LAB — NUSWAB VAGINITIS PLUS (VG+)
Candida albicans, NAA: NEGATIVE
Candida glabrata, NAA: NEGATIVE
Chlamydia trachomatis, NAA: NEGATIVE
Neisseria gonorrhoeae, NAA: NEGATIVE
Trich vag by NAA: POSITIVE — AB

## 2021-04-03 LAB — MICROSCOPIC EXAMINATION
Casts: NONE SEEN /lpf
Epithelial Cells (non renal): >10 /hpf — AB (ref 0–10)
WBC, UA: >30 /hpf — AB (ref 0–5)

## 2021-04-04 ENCOUNTER — Telehealth: Payer: Self-pay

## 2021-04-04 ENCOUNTER — Other Ambulatory Visit: Payer: Self-pay

## 2021-04-04 MED ORDER — METRONIDAZOLE 500 MG PO TABS: 500.0000 mg | ORAL_TABLET | Freq: Two times a day (BID) | ORAL | 0 refills | Status: DC

## 2021-04-04 NOTE — Telephone Encounter (Signed)
Pt advised that nu swab showed she is positive for [facility] and that causing her symptoms advised that we send metronidazole 500 mg take BID for 7 days not to drink alcohol when she is on antibiotic and also advised her partner know he need to treated as well he can call his PCP ?

## 2021-04-06 LAB — IGP, APTIMA HPV: HPV Aptima: NEGATIVE

## 2021-04-12 ENCOUNTER — Telehealth: Payer: Self-pay

## 2021-04-12 NOTE — Telephone Encounter (Signed)
-----   Message from Lauren K McDonough, PA-C sent at 04/11/2021  1:11 PM EDT ----- ?Please let her know that her pap showed ASCUS which is some atypical cells of unknown significance, but her HPV was negative which puts her risk low for cervical cancer therefore we will repeat her pap in 3 years for screening ?

## 2021-04-12 NOTE — Telephone Encounter (Signed)
-----   Message from Mylinda Latina, PA-C sent at 04/11/2021  1:11 PM EDT ----- ?Please let her know that her pap showed ASCUS which is some atypical cells of unknown significance, but her HPV was negative which puts her risk low for cervical cancer therefore we will repeat her pap in 3 years for screening ?

## 2021-04-12 NOTE — Telephone Encounter (Signed)
Spoke with patient regarding papsmear results on 04/12/2021. ?

## 2021-04-12 NOTE — Telephone Encounter (Signed)
Tried to get in touch with patient twice in order to discuss her papsmear results on 04/12/2021. Also tried to leave a voicemail but mailbox is full.  ?

## 2021-05-01 ENCOUNTER — Ambulatory Visit: Payer: No Typology Code available for payment source | Admitting: Physician Assistant

## 2021-05-15 ENCOUNTER — Telehealth: Payer: Self-pay

## 2021-05-15 NOTE — Telephone Encounter (Signed)
Patient scheduled for home sleep study on 05/18/21 @ [facility]tat ?

## 2021-05-20 ENCOUNTER — Encounter (INDEPENDENT_AMBULATORY_CARE_PROVIDER_SITE_OTHER): Payer: No Typology Code available for payment source | Admitting: Internal Medicine

## 2021-05-20 DIAGNOSIS — G4719 Other hypersomnia: Secondary | ICD-10-CM

## 2021-06-12 ENCOUNTER — Encounter (HOSPITAL_BASED_OUTPATIENT_CLINIC_OR_DEPARTMENT_OTHER): Payer: Self-pay | Admitting: *Deleted

## 2021-06-12 ENCOUNTER — Other Ambulatory Visit: Payer: Self-pay

## 2021-06-12 DIAGNOSIS — M25552 Pain in left hip: Secondary | ICD-10-CM | POA: Diagnosis present

## 2021-06-12 DIAGNOSIS — Z79899 Other long term (current) drug therapy: Secondary | ICD-10-CM | POA: Insufficient documentation

## 2021-06-12 DIAGNOSIS — Z7901 Long term (current) use of anticoagulants: Secondary | ICD-10-CM | POA: Diagnosis not present

## 2021-06-12 LAB — PREGNANCY, URINE: Preg Test, Ur: NEGATIVE

## 2021-06-12 NOTE — ED Triage Notes (Signed)
Pt is here for left hip pain which began yesterday and was not associated with any trauma.  Pt feels a knot on her left hip which she noted this pm

## 2021-06-13 ENCOUNTER — Emergency Department (HOSPITAL_BASED_OUTPATIENT_CLINIC_OR_DEPARTMENT_OTHER): Payer: No Typology Code available for payment source | Admitting: Radiology

## 2021-06-13 ENCOUNTER — Emergency Department (HOSPITAL_BASED_OUTPATIENT_CLINIC_OR_DEPARTMENT_OTHER)
Admission: EM | Admit: 2021-06-13 | Discharge: 2021-06-13 | Disposition: A | Discharge status: Home / Self Care | Payer: No Typology Code available for payment source | Attending: Emergency Medicine | Admitting: Emergency Medicine

## 2021-06-13 DIAGNOSIS — M25552 Pain in left hip: Secondary | ICD-10-CM

## 2021-06-13 MED ORDER — NAPROXEN 250 MG PO TABS
500.0000 mg | ORAL_TABLET | Freq: Once | ORAL | Status: AC
  Administered 2021-06-13: 500 mg via ORAL
  Filled 2021-06-13: qty 2

## 2021-06-13 MED ORDER — NAPROXEN 500 MG PO TABS: 500.0000 mg | ORAL_TABLET | Freq: Two times a day (BID) | ORAL | 0 refills | Status: DC

## 2021-06-13 NOTE — Procedures (Signed)
[facility]  Portable Polysomnogram Report Part 1 Phone: [phone] Fax: [phone]  Patient Name: Kathleen Bridges, Kathleen Bridges Recording Device: Tawana Scale  D.O.B.: 08/30/1995 Acquisition Number: [id number]  Referring Physician: Lynn Ito. PA-C Acquisition Date: 05/20/2021   History: The patient is a 26 years old femalewho was referred for evaluation of possible sleep apnea.  Medical History: anemia, hypothyroidism, vitamin D deficiency, headache PCOS, pre-diabetes.  Medications: Xanax, Eliquis, Synthroid, ergocalciferol, Diflucan.  PROCEDURE  The unattended portable polysomnogram was conducted on the night of 05/20/2021.  The following parameters were monitored: Nasal and oral airflow, and body position. Additionally, thoracic and abdominal movements were recorded by inductance plethysmography. Oxygen saturation (SpO2) and heart rate (ECG) was monitored using a pulse Oximeter.  The tracing was scored using 30 second epochs. Hypopneas were scored per AASM definition VIIID1.B (4% desaturation).    Description: The total recording time was 565.9 minutes. Sleep parameters are not recorded.  Respiratory monitoring demonstrated significant snoring across the night. Virtually all of the recording was in the supine position. There were a total of 6 apneas and hypopneas for a Respiratory Event Index of 0.6 apneas and hypopneas per hour of recording. The average duration of the respiratory events was 16.4 seconds with a maximum duration of 27.5 seconds. The respiratory events were associated with peripheral oxygen desaturations on the average to 94 %. The lowest oxygen desaturation associated with a respiratory event was 81 %. Additionally, the mean oxygen saturation was 94 %. The total duration of oxygen < 90% was 1.1 minutes and <80% was 0.0 minutes.   Cardiac monitoring- The average heart rate during the recording was 71.2 bpm.  Impression: This routine overnight  portable polysomnogram did not demonstrate obstructive sleep apnea with only 6 respiratory events noted.   Recommendations:     A negative home study does not necessarily rule out significant sleep apnea and a repeat study is recommended. Would recommend weight loss in a patient with a BMI of 56.3 lb/in2.  Nanda Quinton., MD, FAASM Diplomate, American Board of Sleep Medicine  Diplomate, ABPN- Sleep Medicine Electronically reviewed and digitally signed   [facility]  Portable Polysomnogram Report Part 2 Phone: [phone] Fax: [phone]    Study Date: 05/20/2021  Patient Name: Kathleen Bridges, Kathleen Bridges Recording Device: Tawana Scale  Sex: F Height: 72.0 in.  D.O.B.: Mar 06, 1995 Weight: 415.0 lbs.  Age: 26 years B.M.I: 56.3 lb/in2   Times and Durations  Lights off clock time:  6:48:55 AM Total Recording Time (TRT): 565.9 minutes  Lights on clock time: 4:14:49 PM Time In Bed (TIB): 565.9 minutes   Summary  AHI 0.6 OAI 0.1 CAI 0.0 Lowest Desat 81  AHI is the number of apneas and hypopneas per hour. OAI is the number of obstructive apneas per hour. CAI is the number of central apneas per hour. Lowest Desat is the lowest blood oxygen level that lasted at least 2 seconds.  RESPIRATORY EVENTS   Index (#/hour) Total # of Events Mean duration  (sec) Max duration  (sec) # of Events by Position       Supine Prone Left Right Up  Central Apneas 0.0 0 0.0 0.0 0    0    0  Obstructive Apneas 0.1 1 12.5 12.5 1    0    0  Mixed Apneas 0.2 2 12.5 13.5 2    0    0  Hypopneas 0.3 3 20.3 27.5 3    0  0  Apneas + Hypopneas 0.6 6 16.4 27.5 6    0    0  Total 0.6 6 16.4 27.5 6    0    0  Time in Position 558.0    0.2    1.2  AHI in Position 0.6    0.0    0.0    Oximetry Summary   Dur. (min) % TIB  <90 % 1.1 0.2  <85 % 0.1 0.0  <80 % 0.0 0.0  <70 % 0.0 0.0  Total Dur (min) < 89 0.4 min  Average (%) 94  Total # of Desats 87  Desat Index (#/hour) 9.6  Desat Max (%) 6  Desat  Max dur (sec) 54.0  Lowest SpO2 % during sleep 81  Duration of Min SpO2 (sec) 3    Heart Rate Stats  Mean HR during sleep (BPM)  Highest HR during sleep 119  (BPM)  Highest HR during TIB  188 (BPM)    Snoring Summary  Total Snoring Episodes 0  Total Duration with Snoring    minutes  Mean Duration of Snoring    seconds  Percentage of Snoring    %

## 2021-06-13 NOTE — ED Notes (Signed)
Pt verbalizes understanding of discharge instructions. Opportunity for questioning and answers were provided. Pt discharged from ED to home.   ? ?

## 2021-06-13 NOTE — ED Provider Notes (Signed)
[facility] EMERGENCY DEPT  Provider Note  CSN: [id number] Arrival date & time: 06/12/21 2113  History Chief Complaint  Patient presents with   Hip Pain    Kathleen Bridges is a 26 y.o. 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 She has also had intermittent L hip pain for several months/years, worse since yesterday. She reports pain in L lateral hip radiating down her thigh. She felt a lump in her lateral leg yesterday but not today. No CP or SOB.    Home Medications Prior to Admission medications   Medication Sig Start Date End Date Taking? Authorizing Provider  naproxen (NAPROSYN) 500 MG tablet Take 1 tablet (500 mg total) by mouth 2 (two) times daily. 06/13/21  Yes Pollyann Savoy, MD  Acetaminophen (TYLENOL 8 HOUR PO) Take 2 tablets by mouth daily as needed (pain).    [provider]  ALPRAZolam Prudy Feeler) 0.25 MG tablet Take 1 tablet by mouth daily as needed.    [provider]  apixaban (ELIQUIS) 5 MG TABS tablet Take 1 tablet (5 mg total) by mouth 2 (two) times daily. 03/14/21   McDonough, Salomon Fick, PA-C  ergocalciferol (DRISDOL) 1.25 MG (50000 UT) capsule Take one cap q week 03/14/21   McDonough, Lauren K, PA-C  escitalopram (LEXAPRO) 5 MG tablet Take 1 tablet (5 mg total) by mouth daily. 03/30/21   McDonough, Salomon Fick, PA-C  levothyroxine (SYNTHROID) 25 MCG tablet Take 1 tablet (25 mcg total) by mouth daily. 03/30/21   McDonough, Salomon Fick, PA-C  metroNIDAZOLE (FLAGYL) 500 MG tablet Take 1 tablet (500 mg total) by mouth 2 (two) times daily. 04/04/21   McDonough, Salomon Fick, PA-C  norgestimate-ethinyl estradiol (ORTHO-CYCLEN,SPRINTEC,PREVIFEM) 0.25-35 MG-MCG tablet Take 1 tablet by mouth daily. 08/31/13 10/06/19  Leftwich-Kirby, Wilmer Floor, CNM     Allergies    Patient has no known allergies.   Review of Systems   Review of Systems Please  see HPI for pertinent positives and negatives  Physical Exam BP (!) 147/87 (BP Location: Right Arm)   Pulse 100   Temp 98 F (36.7 C) (Oral)   Resp 14   Wt (!) 184.6 kg   LMP 04/22/2021 Comment: neg preg test  SpO2 100%   BMI 55.20 kg/m   Physical Exam Vitals and nursing note reviewed.  Constitutional:      Appearance: Normal appearance.  HENT:     Head: Normocephalic and atraumatic.     Nose: Nose normal.     Mouth/Throat:     Mouth: Mucous membranes are moist.  Eyes:     Extraocular Movements: Extraocular movements intact.     Conjunctiva/sclera: Conjunctivae normal.  Cardiovascular:     Rate and Rhythm: Normal rate.     Pulses: Normal pulses.  Pulmonary:     Effort: Pulmonary effort is normal.     Breath sounds: Normal breath sounds.  Abdominal:     General: Abdomen is flat.     Palpations: Abdomen is soft.     Tenderness: There is no abdominal tenderness.  Musculoskeletal:        General: Tenderness (L lateral hip, no calf tenderness or tenderness/cord in proximal deep veins) present. No swelling or deformity. Normal range of motion.     Cervical back: Neck supple.  Skin:    General: Skin is warm and dry.  Neurological:  General: No focal deficit present.     Mental Status: She is alert.  Psychiatric:        Mood and Affect: Mood normal.     ED Results / Procedures / Treatments   EKG None  Procedures Procedures  Medications Ordered in the ED Medications  naproxen (NAPROSYN) tablet 500 mg (has no administration in time range)    Initial Impression and Plan  Patient here with L lateral hip/leg pain. No concern for DVT as symptoms are not in areas of deep veins and no distal signs of clot. I personally viewed the images from radiology studies and agree with radiologist interpretation: xray is neg.   She likely has a hip bursitis. Recommend NSAIDs, rest and close PCP follow up. RTED for any other concerns.   ED Course       MDM  Rules/Calculators/A&P Medical Decision Making Problems Addressed: Left hip pain: chronic illness or injury with exacerbation, progression, or side effects of treatment  Amount and/or Complexity of Data Reviewed Radiology: ordered and independent interpretation performed. Decision-making details documented in ED Course.  Risk Prescription drug management.    Final Clinical Impression(s) / ED Diagnoses Final diagnoses:  Left hip pain    Rx / DC Orders ED Discharge Orders          Ordered    naproxen (NAPROSYN) 500 MG tablet  2 times daily        06/13/21 0106             Pollyann Savoy, MD 06/13/21 [phone]

## 2021-07-30 ENCOUNTER — Emergency Department (HOSPITAL_COMMUNITY)
Admission: EM | Admit: 2021-07-30 | Discharge: 2021-07-30 | Disposition: A | Discharge status: Home / Self Care | Payer: No Typology Code available for payment source | Attending: Emergency Medicine | Admitting: Emergency Medicine

## 2021-07-30 ENCOUNTER — Emergency Department (HOSPITAL_COMMUNITY): Payer: No Typology Code available for payment source

## 2021-07-30 ENCOUNTER — Emergency Department (HOSPITAL_BASED_OUTPATIENT_CLINIC_OR_DEPARTMENT_OTHER): Payer: No Typology Code available for payment source

## 2021-07-30 ENCOUNTER — Encounter (HOSPITAL_COMMUNITY): Payer: Self-pay

## 2021-07-30 ENCOUNTER — Other Ambulatory Visit: Payer: Self-pay

## 2021-07-30 DIAGNOSIS — M79672 Pain in left foot: Secondary | ICD-10-CM | POA: Insufficient documentation

## 2021-07-30 DIAGNOSIS — Z20822 Contact with and (suspected) exposure to covid-19: Secondary | ICD-10-CM | POA: Diagnosis not present

## 2021-07-30 DIAGNOSIS — R0602 Shortness of breath: Secondary | ICD-10-CM | POA: Insufficient documentation

## 2021-07-30 DIAGNOSIS — M79604 Pain in right leg: Secondary | ICD-10-CM | POA: Insufficient documentation

## 2021-07-30 DIAGNOSIS — R079 Chest pain, unspecified: Secondary | ICD-10-CM | POA: Diagnosis not present

## 2021-07-30 DIAGNOSIS — Z86718 Personal history of other venous thrombosis and embolism: Secondary | ICD-10-CM

## 2021-07-30 DIAGNOSIS — R519 Headache, unspecified: Secondary | ICD-10-CM | POA: Insufficient documentation

## 2021-07-30 DIAGNOSIS — R110 Nausea: Secondary | ICD-10-CM | POA: Diagnosis not present

## 2021-07-30 DIAGNOSIS — Z86711 Personal history of pulmonary embolism: Secondary | ICD-10-CM | POA: Insufficient documentation

## 2021-07-30 DIAGNOSIS — H53149 Visual discomfort, unspecified: Secondary | ICD-10-CM | POA: Insufficient documentation

## 2021-07-30 DIAGNOSIS — Z7901 Long term (current) use of anticoagulants: Secondary | ICD-10-CM | POA: Insufficient documentation

## 2021-07-30 LAB — CBC WITH DIFFERENTIAL/PLATELET
Abs Immature Granulocytes: 0.02 10*3/uL (ref 0.00–0.07)
Basophils Absolute: 0 10*3/uL (ref 0.0–0.1)
Basophils Relative: 1 %
Eosinophils Absolute: 0.1 10*3/uL (ref 0.0–0.5)
Eosinophils Relative: 2 %
HCT: 37.1 % (ref 36.0–46.0)
Hemoglobin: 11.7 g/dL — ABNORMAL LOW (ref 12.0–15.0)
Immature Granulocytes: 0 %
Lymphocytes Relative: 40 %
Lymphs Abs: 2.1 10*3/uL (ref 0.7–4.0)
MCH: 19.6 pg — ABNORMAL LOW (ref 26.0–34.0)
MCHC: 31.5 g/dL (ref 30.0–36.0)
MCV: 62 fL — ABNORMAL LOW (ref 80.0–100.0)
Monocytes Absolute: 0.4 10*3/uL (ref 0.1–1.0)
Monocytes Relative: 7 %
Neutro Abs: 2.7 10*3/uL (ref 1.7–7.7)
Neutrophils Relative %: 50 %
Platelets: 302 10*3/uL (ref 150–400)
RBC: 5.98 MIL/uL — ABNORMAL HIGH (ref 3.87–5.11)
RDW: 18.9 % — ABNORMAL HIGH (ref 11.5–15.5)
WBC: 5.3 10*3/uL (ref 4.0–10.5)
nRBC: 0 % (ref 0.0–0.2)

## 2021-07-30 LAB — BASIC METABOLIC PANEL
Anion gap: 7 (ref 5–15)
BUN: 10 mg/dL (ref 6–20)
CO2: 21 mmol/L — ABNORMAL LOW (ref 22–32)
Calcium: 9 mg/dL (ref 8.9–10.3)
Chloride: 109 mmol/L (ref 98–111)
Creatinine, Ser: 0.7 mg/dL (ref 0.44–1.00)
GFR, Estimated: >60 mL/min (ref >60)
Glucose, Bld: 81 mg/dL (ref 70–99)
Potassium: 3.8 mmol/L (ref 3.5–5.1)
Sodium: 137 mmol/L (ref 135–145)

## 2021-07-30 LAB — SARS CORONAVIRUS 2 BY RT PCR: SARS Coronavirus 2 by RT PCR: NEGATIVE

## 2021-07-30 LAB — TROPONIN I (HIGH SENSITIVITY)
Troponin I (High Sensitivity): <2 ng/L (ref <18)
Troponin I (High Sensitivity): 3 ng/L (ref <18)

## 2021-07-30 LAB — BRAIN NATRIURETIC PEPTIDE: B Natriuretic Peptide: 22.8 pg/mL (ref 0.0–100.0)

## 2021-07-30 MED ORDER — KETOROLAC TROMETHAMINE 30 MG/ML IJ SOLN: 30.0000 mg | Freq: Once | INTRAMUSCULAR | Status: DC

## 2021-07-30 MED ORDER — KETOROLAC TROMETHAMINE 15 MG/ML IJ SOLN
15.0000 mg | Freq: Once | INTRAMUSCULAR | Status: AC
  Administered 2021-07-30: 15 mg via INTRAVENOUS
  Filled 2021-07-30: qty 1

## 2021-07-30 MED ORDER — DIPHENHYDRAMINE HCL 50 MG/ML IJ SOLN
12.5000 mg | Freq: Once | INTRAMUSCULAR | Status: AC
  Administered 2021-07-30: 12.5 mg via INTRAVENOUS
  Filled 2021-07-30: qty 1

## 2021-07-30 MED ORDER — IOHEXOL 350 MG/ML SOLN
100.0000 mL | Freq: Once | INTRAVENOUS | Status: AC | PRN
  Administered 2021-07-30: 100 mL via INTRAVENOUS

## 2021-07-30 MED ORDER — PROCHLORPERAZINE EDISYLATE 10 MG/2ML IJ SOLN
10.0000 mg | Freq: Once | INTRAMUSCULAR | Status: AC
  Administered 2021-07-30: 10 mg via INTRAVENOUS
  Filled 2021-07-30: qty 2

## 2021-07-30 NOTE — ED Provider Notes (Signed)
Care transferred from previous provider at shift change.  See note for full HPI.  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 SOB, right LE swelling, HA. Non focal neuroexam. Plan on labs and imaging if neg dc home with sx management. Physical Exam  BP 134/82 (BP Location: Left Wrist)   Pulse 71   Temp 98.2 F (36.8 C) (Oral)   Resp 20   Ht 6' (1.829 m)   Wt (!) 176.4 kg   LMP 07/24/2021   SpO2 100%   BMI 52.76 kg/m   Physical Exam Vitals and nursing note reviewed.  Constitutional:      General: She is not in acute distress.    Appearance: She is well-developed. She is not ill-appearing.  HENT:     Head: Normocephalic and atraumatic.  Eyes:     General: No visual field deficit.    Pupils: Pupils are equal, round, and reactive to light.  Cardiovascular:     Rate and Rhythm: Normal rate.  Pulmonary:     Effort: Pulmonary effort is normal. No respiratory distress.     Breath sounds: Normal breath sounds.  Abdominal:     General: Bowel sounds are normal. There is no distension.     Palpations: Abdomen is soft.  Musculoskeletal:        General: No swelling or tenderness. Normal range of motion.     Cervical back: Normal range of motion.  Skin:    General: Skin is warm and dry.     Capillary Refill: Capillary refill takes less than 2 seconds.  Neurological:     General: No focal deficit present.     Mental Status: She is alert and oriented to person, place, and time.     Cranial Nerves: No cranial nerve deficit, dysarthria or facial asymmetry.     Sensory: No sensory deficit.     Motor: No weakness.  Psychiatric:        Mood and Affect: Mood normal.     Procedures  Procedures Labs Reviewed  BASIC METABOLIC PANEL - Abnormal; Notable for the following components:      Result Value   CO2 21 (*)    All other components within normal limits  CBC WITH DIFFERENTIAL/PLATELET - Abnormal; Notable for the  following components:   RBC 5.98 (*)    Hemoglobin 11.7 (*)    MCV 62.0 (*)    MCH 19.6 (*)    RDW 18.9 (*)    All other components within normal limits  SARS CORONAVIRUS 2 BY RT PCR  BRAIN NATRIURETIC PEPTIDE  I-STAT BETA HCG BLOOD, ED ([facility], [facility], [facility] ONLY)  TROPONIN I (HIGH SENSITIVITY)  TROPONIN I (HIGH SENSITIVITY)   CT Angio Chest PE W and/or Wo Contrast  Result Date: 07/30/2021 CLINICAL DATA:  Concern for pulmonary embolism. EXAM: CT ANGIOGRAPHY CHEST WITH CONTRAST TECHNIQUE: Multidetector CT imaging of the chest was performed using the standard protocol during bolus administration of intravenous contrast. Multiplanar CT image reconstructions and MIPs were obtained to evaluate the vascular anatomy. RADIATION DOSE REDUCTION: This exam was performed according to the departmental dose-optimization program which includes automated exposure control, adjustment of the mA and/or kV according to patient size and/or use of iterative reconstruction technique. CONTRAST:  136mL OMNIPAQUE IOHEXOL 350 MG/ML SOLN COMPARISON:  Chest CT dated 10/06/2019 and radiograph dated 07/30/2021. FINDINGS: Evaluation is limited due to streak artifact caused by body habitus. Cardiovascular: There is no cardiomegaly or pericardial effusion.  The thoracic aorta is unremarkable. Evaluation of the pulmonary arteries is limited due to respiratory motion and streak artifact caused by patient's body habitus. No large or central pulmonary artery embolus identified. Mediastinum/Nodes: No hilar or mediastinal adenopathy. The esophagus is grossly unremarkable. No mediastinal fluid collection. Lungs/Pleura: The lungs are clear. There is no pleural effusion or pneumothorax. The central airways are patent. Upper Abdomen: Fatty liver. Musculoskeletal: No chest wall abnormality. No acute or significant osseous findings. Review of the MIP images confirms the above findings. IMPRESSION: 1. No acute intrathoracic pathology. No CT evidence of  central pulmonary artery embolus. 2. Fatty liver. Electronically Signed   By: Elgie Collard M.D.   On: 07/30/2021 19:34   CT Head Wo Contrast  Result Date: 07/30/2021 CLINICAL DATA:  Severe headache. EXAM: CT HEAD WITHOUT CONTRAST TECHNIQUE: Contiguous axial images were obtained from the base of the skull through the vertex without intravenous contrast. RADIATION DOSE REDUCTION: This exam was performed according to the departmental dose-optimization program which includes automated exposure control, adjustment of the mA and/or kV according to patient size and/or use of iterative reconstruction technique. COMPARISON:  None Available. FINDINGS: Brain: No evidence of acute infarction, hemorrhage, hydrocephalus, extra-axial collection or mass lesion/mass effect. Vascular: No hyperdense vessel or unexpected calcification. Skull: Normal. Negative for fracture or focal lesion. Sinuses/Orbits: No acute finding. Other: None. IMPRESSION: No acute intracranial abnormality. Electronically Signed   By: Darliss Cheney M.D.   On: 07/30/2021 19:27   VAS [place] LOWER EXTREMITY VENOUS (DVT) (7a-7p)  Result Date: 07/30/2021  Lower Venous DVT Study Patient Name:  Kathleen Bridges [facility]  Date of Exam:   07/30/2021 Medical Rec #: [id number]          Accession #:    [id number] Date of Birth: 1995-02-22          Patient Gender: F Patient Age:   30 years Exam Location:  [facility] Procedure:      VAS [place] LOWER EXTREMITY VENOUS (DVT) Referring Phys: HALEY SAGE --------------------------------------------------------------------------------  Indications: Pain in right leg, history of DVT.  Limitations: Body habitus. Comparison Study: No prior studies available. Performing Technologist: Jean Rosenthal RDMS, RVT  Examination Guidelines: A complete evaluation includes B-mode imaging, spectral Doppler, color Doppler, and power Doppler as needed of all accessible portions of each vessel. Bilateral testing is considered an integral part of a  complete examination. Limited examinations for reoccurring indications may be performed as noted. The reflux portion of the exam is performed with the patient in reverse Trendelenburg.  +---------+---------------+---------+-----------+----------+--------------+ RIGHT    CompressibilityPhasicitySpontaneityPropertiesThrombus Aging +---------+---------------+---------+-----------+----------+--------------+ CFV      Full           Yes      Yes                                 +---------+---------------+---------+-----------+----------+--------------+ SFJ      Full                                                        +---------+---------------+---------+-----------+----------+--------------+ FV Prox  Full                                                        +---------+---------------+---------+-----------+----------+--------------+  FV Mid   Full                                                        +---------+---------------+---------+-----------+----------+--------------+ FV DistalFull                                                        +---------+---------------+---------+-----------+----------+--------------+ PFV      Full                                                        +---------+---------------+---------+-----------+----------+--------------+ POP      Full           Yes      Yes                                 +---------+---------------+---------+-----------+----------+--------------+ PTV      Full                                                        +---------+---------------+---------+-----------+----------+--------------+ PERO     Full                                                        +---------+---------------+---------+-----------+----------+--------------+ Gastroc  Full                                                        +---------+---------------+---------+-----------+----------+--------------+    +----+---------------+---------+-----------+----------+--------------+ LEFTCompressibilityPhasicitySpontaneityPropertiesThrombus Aging +----+---------------+---------+-----------+----------+--------------+ CFV Full           Yes      Yes                                 +----+---------------+---------+-----------+----------+--------------+    Summary: RIGHT: - There is no evidence of deep vein thrombosis in the lower extremity.  - No cystic structure found in the popliteal fossa.  - Prolonged reflux times noted in the right posterior tibial veins.  LEFT: - No evidence of common femoral vein obstruction.  *See table(s) above for measurements and observations.    Preliminary    DG Chest 2 View  Result Date: 07/30/2021 CLINICAL DATA:  26 year old female presents with chest pain. EXAM: CHEST - 2 VIEW COMPARISON:  October 07, 2019. FINDINGS: Top-normal heart size. Trachea midline. Hilar structures are normal. Lungs are clear.  No sign of pleural effusion. On limited assessment no acute skeletal process.  IMPRESSION: No active cardiopulmonary disease. Mild cardiomegaly/top-normal heart size. Electronically Signed   By: Donzetta Kohut M.D.   On: 07/30/2021 14:34    ED Course / MDM     26 year old history of prior PE, not anticoagulated, thought due to prior birth control use which she is no longer using here for evaluation of headache, leg pain as well as shortness of breath.  Labs and imaging personally viewed and interpreted:  No significant findings  Patient reassessed after migraine cocktail.  Feels significantly improved.  DC home symptomatic management   The patient has been appropriately medically screened and/or stabilized in the ED. I have low suspicion for any other emergent medical condition which would require further screening, evaluation or treatment in the ED or require inpatient management.  Patient is hemodynamically stable and in no acute distress.  Patient able to ambulate  in department prior to ED.  Evaluation does not show acute pathology that would require ongoing or additional emergent interventions while in the emergency department or further inpatient treatment.  I have discussed the diagnosis with the patient and answered all questions.  Pain is been managed while in the emergency department and patient has no further complaints prior to discharge.  Patient is comfortable with plan discussed in room and is stable for discharge at this time.  I have discussed strict return precautions for returning to the emergency department.  Patient was encouraged to follow-up with PCP/specialist refer to at discharge.    Medical Decision Making Amount and/or Complexity of Data Reviewed Independent Historian: friend External Data Reviewed: labs, radiology, ECG and notes. Labs: ordered. Decision-making details documented in ED Course. Radiology: ordered and independent interpretation performed. Decision-making details documented in ED Course. ECG/medicine tests: ordered and independent interpretation performed. Decision-making details documented in ED Course.  Risk OTC drugs. Prescription drug management. Parenteral controlled substances. Decision regarding hospitalization. Diagnosis or treatment significantly limited by social determinants of health.    Headache SOB Bilateral leg pain      Keyonta Barradas A, PA-C 07/30/21 2228    Alvira Monday, MD 07/31/21 1119

## 2021-07-30 NOTE — ED Provider Notes (Signed)
[facility] EMERGENCY DEPARTMENT Provider Note   CSN: [id number] Arrival date & time: 07/30/21  1134     History  Chief Complaint  Patient presents with   Headache   Leg Pain   Chest Pain    Shortness of breath    Kathleen Bridges is a 26 y.o. female.   Headache Leg Pain   Patient presents due to headache, right lower leg pain, chest pain and shortness of breath.  Patient was previously anticoagulated on Eliquis due to previous PE.  She has not been taking her medicine for some number of months secondary to not having it prescribed.  A few weeks ago she started having intermittent chest pain, she also feels short of breath.  Chest pain comes and goes, is not particularly pleuritic in nature.  She is having right lower extremity calf pain posteriorly that moves up her thigh medially.  This started a few days ago.  States it feels like her previous DVT.  Also having headache that started last night and has been constant.  Worse headache of life, associated with photophobia and nausea.  No vomiting, denies any lateralized weakness or paresthesias.  States she has a history of sinus headaches which has been taking her home Allegra and that has not had any improvement.  Patient also complaining of left foot pain.  Is on the plantar surface of the foot, worse in the morning when she first lays her foot on the floor.  Atraumatic.  Home Medications Prior to Admission medications   Medication Sig Start Date End Date Taking? Authorizing Provider  Acetaminophen (TYLENOL 8 HOUR PO) Take 2 tablets by mouth daily as needed (pain).    [provider]  ALPRAZolam Prudy Feeler) 0.25 MG tablet Take 1 tablet by mouth daily as needed.    [provider]  apixaban (ELIQUIS) 5 MG TABS tablet Take 1 tablet (5 mg total) by mouth 2 (two) times daily. 03/14/21   McDonough, Salomon Fick, PA-C  ergocalciferol (DRISDOL) 1.25 MG (50000 UT) capsule Take one cap q week 03/14/21    McDonough, Lauren K, PA-C  escitalopram (LEXAPRO) 5 MG tablet Take 1 tablet (5 mg total) by mouth daily. 03/30/21   McDonough, Salomon Fick, PA-C  levothyroxine (SYNTHROID) 25 MCG tablet Take 1 tablet (25 mcg total) by mouth daily. 03/30/21   McDonough, Salomon Fick, PA-C  metroNIDAZOLE (FLAGYL) 500 MG tablet Take 1 tablet (500 mg total) by mouth 2 (two) times daily. 04/04/21   McDonough, Salomon Fick, PA-C  naproxen (NAPROSYN) 500 MG tablet Take 1 tablet (500 mg total) by mouth 2 (two) times daily. 06/13/21   Pollyann Savoy, MD  norgestimate-ethinyl estradiol (ORTHO-CYCLEN,SPRINTEC,PREVIFEM) 0.25-35 MG-MCG tablet Take 1 tablet by mouth daily. 08/31/13 10/06/19  Leftwich-Kirby, Wilmer Floor, CNM      Allergies    Patient has no known allergies.    Review of Systems   Review of Systems  Neurological:  Positive for headaches.    Physical Exam Updated Vital Signs BP (!) 157/110 (BP Location: Right Arm)   Pulse 88   Temp 98 F (36.7 C) (Oral)   Resp 18   Ht 6' (1.829 m)   Wt (!) 176.4 kg   LMP 07/24/2021   SpO2 94%   BMI 52.76 kg/m  Physical Exam Vitals and nursing note reviewed. Exam conducted with a chaperone present.  Constitutional:      Appearance: Normal appearance.  HENT:     Head: Normocephalic and atraumatic.  Eyes:     General: No scleral icterus.       Right eye: No discharge.        Left eye: No discharge.     Extraocular Movements: Extraocular movements intact.     Pupils: Pupils are equal, round, and reactive to light.  Neck:     Comments: No meningismus, JVD or carotid bruits Cardiovascular:     Rate and Rhythm: Normal rate and regular rhythm.     Pulses: Normal pulses.     Heart sounds: Normal heart sounds. No murmur heard.    No friction rub. No gallop.     Comments: Regular rate and rhythm, upper and lower extremity pulses 2+ and symmetric bilaterally. Pulmonary:     Effort: Pulmonary effort is normal. No respiratory distress.     Breath sounds: Normal breath sounds.      Comments: Lungs clear to auscultation bilaterally though somewhat diminished secondary to body habitus. Abdominal:     General: Abdomen is flat. Bowel sounds are normal. There is no distension.     Palpations: Abdomen is soft.     Tenderness: There is no abdominal tenderness.  Musculoskeletal:     Cervical back: Normal range of motion. No rigidity.     Right lower leg: No edema.     Left lower leg: No edema.     Comments: Tenderness to palpation of posterior right calf.  Moves upper and lower extremities any difficulty.  Skin:    General: Skin is warm and dry.     Capillary Refill: Capillary refill takes less than 2 seconds.     Coloration: Skin is not jaundiced.     Comments: Lower extremities warm and well-perfused.  Neurological:     Mental Status: She is alert. Mental status is at baseline.     Coordination: Coordination normal.     Comments: Cranial nerves III through XII are grossly intact.  Grip strength equal bilaterally, lower extremity strength equal bilaterally.  No pronator drift, no dysarthria.     ED Results / Procedures / Treatments   Labs (all labs ordered are listed, but only abnormal results are displayed) Labs Reviewed  SARS CORONAVIRUS 2 BY RT PCR  BASIC METABOLIC PANEL  CBC WITH DIFFERENTIAL/PLATELET  BRAIN NATRIURETIC PEPTIDE  I-STAT BETA HCG BLOOD, ED ([facility], [facility], [facility] ONLY)  TROPONIN I (HIGH SENSITIVITY)    EKG None  Radiology VAS [place] LOWER EXTREMITY VENOUS (DVT) (7a-7p)  Result Date: 07/30/2021  Lower Venous DVT Study Patient Name:  RHETTA Bridges [facility]  Date of Exam:   07/30/2021 Medical Rec #: [id number]          Accession #:    [id number] Date of Birth: 1995/05/31          Patient Gender: F Patient Age:   12 years Exam Location:  [facility] Procedure:      VAS [place] LOWER EXTREMITY VENOUS (DVT) Referring Phys: Misha Vanoverbeke --------------------------------------------------------------------------------  Indications: Pain in right leg, history of DVT.   Limitations: Body habitus. Comparison Study: No prior studies available. Performing Technologist: Jean Rosenthal RDMS, RVT  Examination Guidelines: A complete evaluation includes B-mode imaging, spectral Doppler, color Doppler, and power Doppler as needed of all accessible portions of each vessel. Bilateral testing is considered an integral part of a complete examination. Limited examinations for reoccurring indications may be performed as noted. The reflux portion of the exam is performed with the patient in reverse Trendelenburg.  +---------+---------------+---------+-----------+----------+--------------+ RIGHT    CompressibilityPhasicitySpontaneityPropertiesThrombus Aging +---------+---------------+---------+-----------+----------+--------------+  CFV      Full           Yes      Yes                                 +---------+---------------+---------+-----------+----------+--------------+ SFJ      Full                                                        +---------+---------------+---------+-----------+----------+--------------+ FV Prox  Full                                                        +---------+---------------+---------+-----------+----------+--------------+ FV Mid   Full                                                        +---------+---------------+---------+-----------+----------+--------------+ FV DistalFull                                                        +---------+---------------+---------+-----------+----------+--------------+ PFV      Full                                                        +---------+---------------+---------+-----------+----------+--------------+ POP      Full           Yes      Yes                                 +---------+---------------+---------+-----------+----------+--------------+ PTV      Full                                                         +---------+---------------+---------+-----------+----------+--------------+ PERO     Full                                                        +---------+---------------+---------+-----------+----------+--------------+ Gastroc  Full                                                        +---------+---------------+---------+-----------+----------+--------------+   +----+---------------+---------+-----------+----------+--------------+  LEFTCompressibilityPhasicitySpontaneityPropertiesThrombus Aging +----+---------------+---------+-----------+----------+--------------+ CFV Full           Yes      Yes                                 +----+---------------+---------+-----------+----------+--------------+    Summary: RIGHT: - There is no evidence of deep vein thrombosis in the lower extremity.  - No cystic structure found in the popliteal fossa.  - Prolonged reflux times noted in the right posterior tibial veins.  LEFT: - No evidence of common femoral vein obstruction.  *See table(s) above for measurements and observations.    Preliminary    DG Chest 2 View  Result Date: 07/30/2021 CLINICAL DATA:  26 year old female presents with chest pain. EXAM: CHEST - 2 VIEW COMPARISON:  October 07, 2019. FINDINGS: Top-normal heart size. Trachea midline. Hilar structures are normal. Lungs are clear.  No sign of pleural effusion. On limited assessment no acute skeletal process. IMPRESSION: No active cardiopulmonary disease. Mild cardiomegaly/top-normal heart size. Electronically Signed   By: Donzetta Kohut M.D.   On: 07/30/2021 14:34    Procedures Procedures    Medications Ordered in ED Medications - No data to display  ED Course/ Medical Decision Making/ A&P                           Medical Decision Making Amount and/or Complexity of Data Reviewed Independent Historian: friend    Details: Patient's girlfriend is at bedside. External Data Reviewed: labs and notes.    Details: Patient  followed by pulmonology due to history of PEs. Labs: ordered. Radiology: ordered. Decision-making details documented in ED Course. ECG/medicine tests: ordered.  Risk Prescription drug management.   Patient presents due to headache, chest pain, shortness of breath and right lower extremity pain and tenderness.  On exam patient is mildly hypertensive, vitals otherwise unremarkable.  She is not hypoxic, regular rhythm without any tachycardia.  There is some calf tenderness posteriorly to right calf, she is neurovascular intact with well-perfused lower extremities.  Do not think this is ischemic limb.  I ordered and reviewed imaging. Per my interpretation:  DVT study of right calf is negative for DVT.   CXR Plain film is notable for mild cardiomegaly versus top of normal range for heart size.  No acute process, agree cardiology interpretation.   CT head is pending at shift change.   Remainder of work-up is pending at time of shift change.  Disposition pending work-up, case discussed with PA anteriorly at shift change who will follow.  Please see her note for disposition.         Final Clinical Impression(s) / ED Diagnoses Final diagnoses:  None    Rx / DC Orders ED Discharge Orders     None         Theron Arista, PA-C 07/30/21 1521    Arby Barrette, MD 08/10/21 2201

## 2021-07-30 NOTE — ED Notes (Signed)
Patient transported to CT 

## 2021-07-30 NOTE — ED Triage Notes (Signed)
Pt reports headache since last night, bilateral leg pain and left foot pain since Thursday.  Right leg; shooting pain in thigh Left hip: pain radiates down into her knee. Pt ambulatory

## 2021-07-30 NOTE — ED Notes (Signed)
Patient verbalizes understanding of discharge instructions. Opportunity for questioning and answers were provided. Armband removed by staff, pt discharged from ED.  

## 2021-07-30 NOTE — Discharge Instructions (Signed)
Work-up here is unremarkable.  Follow-up with your primary care provider  Return for any new or worsening symptoms.

## 2021-07-30 NOTE — Progress Notes (Signed)
Lower extremity venous right study completed.  Preliminary results relayed to Sage, PA.  See CV Proc for preliminary results report.   Brennen Gardiner, RDMS, RVT   

## 2021-08-14 ENCOUNTER — Ambulatory Visit: Payer: No Typology Code available for payment source | Admitting: Family

## 2021-08-17 ENCOUNTER — Telehealth: Payer: Self-pay

## 2021-08-17 ENCOUNTER — Encounter: Payer: Self-pay | Admitting: Family

## 2021-08-17 ENCOUNTER — Ambulatory Visit: Payer: No Typology Code available for payment source | Admitting: Family

## 2021-08-17 VITALS — BP 120/85 | HR 95 | Temp 98.1°F | Ht 72.0 in | Wt >= 6400 oz

## 2021-08-17 DIAGNOSIS — E282 Polycystic ovarian syndrome: Secondary | ICD-10-CM | POA: Insufficient documentation

## 2021-08-17 DIAGNOSIS — D649 Anemia, unspecified: Secondary | ICD-10-CM | POA: Insufficient documentation

## 2021-08-17 DIAGNOSIS — Z86718 Personal history of other venous thrombosis and embolism: Secondary | ICD-10-CM | POA: Diagnosis not present

## 2021-08-17 DIAGNOSIS — F419 Anxiety disorder, unspecified: Secondary | ICD-10-CM | POA: Insufficient documentation

## 2021-08-17 NOTE — Patient Instructions (Signed)
Welcome to [facility], It was a pleasure meeting you today!    As discussed, I have sent a referral to Hematology to follow up on your history of DVT (leg clot) and to see if you need to get back on the Eliquis. Do not take the medication now until you are seen by this office.  I also have sent a referral to our Weight management clinic to discuss weight loss options.  Check with your insurance and see if they cover Wegovy, Saxenda, or Ozempic injectables and if so let me know and we can get this started for you. Be sure the meds are covered under weight loss and NOT diabetes.      PLEASE NOTE: If you had any LAB tests please let us know if you have not heard back within a few days. You may see your results on MyChart before we have a chance to review them but we will give you a call once they are reviewed by [place]. If we ordered any REFERRALS today, please let us know if you have not heard from their office within the next week.  Let us know through MyChart if you are needing REFILLS, or have your pharmacy send [place] the request. You can also use MyChart to communicate with me or any office staff.   Please try these tips to maintain a healthy lifestyle:  Eat most of your calories during the day when you are active. Eliminate processed foods including packaged sweets (pies, cakes, cookies), reduce intake of potatoes, white bread, white pasta, and white rice. Look for whole grain options, oat flour or almond flour.  Each meal should contain half fruits/vegetables, one quarter protein, and one quarter carbs (no bigger than a computer mouse).  Cut down on sweet beverages. This includes juice, soda, and sweet tea. Also watch fruit intake, though this is a healthier sweet option, it still contains natural sugar! Limit to 3 servings daily.  Drink at least 1 8oz. glass of water with each meal and aim for at least 8 glasses per day  Exercise at least 150 minutes every  week.

## 2021-08-17 NOTE — Telephone Encounter (Signed)
SS appointment>>08/18/21-Kathleen Bridges

## 2021-08-17 NOTE — Progress Notes (Signed)
New Patient Office Visit  Subjective:  Patient ID: Kathleen Bridges, female    DOB: [birth year]  Age: 26 y.o. MRN: [id number]  CC:  Chief Complaint  Patient presents with   Establish Care   Follow-up    Pt states she went to ED on 07/30/2021, Pt was seen for headache, right lower leg pain, chest pain and shortness of breath, DVT.  Patient was previously on Eliquis. She has not been taking her medicine for some months. Chest pain comes and goes. Pt was also having a headache.    HPI MARGUERITTE GUTHRIDGE presents for establishing care today.  History of DVT:  occurred in 2019, pt had been taking OCPs, advised to d/c & took Eliquis until 2021, but then ran out of med and was unsure if she needed to continue. Reports not liking her last PCP and did not take any meds that she recommended. Recently in ER for leg pain, chest pain, SOB, but cardiac & pulmonary etiology ruled out.  Obesity:  long hx of heavy weight, reports both parents very heavy. She has tried different diets in past and will lose some weight but nothing sustainable. Does not exercise, but works in [employer] and is active. Hx of DVT and she understands her weight puts her at risk for more clotting. Reports she has been seen by Pulmonary and awaiting results of a sleep study. Denies taking any wt loss medications in past or participating in any commercial wt loss programs.  Assessment & Plan:   Problem List Items Addressed This Visit       Other   History of deep vein thrombosis - Primary    First DX 2019, no re-occurrence reports taking Eliquis for 2 or 3 years, then ran out and was unsure if she still needed, did not have PCP and not seen by [facility] referring to [facility], d/t morbid obesity would like them to assess her risk       Relevant Orders   Ambulatory referral to Hematology / Oncology   Morbid obesity (HCC)    Chronic no hx of medications or referrals for obesity referring to Weight mgt clinic advised pt to  check ins. if any meds are covered Wt. Loss strategies reviewed including portion control, less carbs including sweets, eating most of calories earlier in day, drinking 64oz water qd, and establishing daily exercise routine. f/u prn      Relevant Orders   Amb Ref to Medical Weight Management    Subjective:    Outpatient Medications Prior to Visit  Medication Sig Dispense Refill   albuterol (VENTOLIN HFA) 108 (90 Base) MCG/ACT inhaler Inhale 2 puffs into the lungs every 6 (six) hours as needed.     Naproxen Sodium (ALEVE PO) Take by mouth as needed.     Acetaminophen (TYLENOL 8 HOUR PO) Take 2 tablets by mouth daily as needed (pain).     metroNIDAZOLE (FLAGYL) 500 MG tablet Take 1 tablet (500 mg total) by mouth 2 (two) times daily. 14 tablet 0   naproxen (NAPROSYN) 500 MG tablet Take 1 tablet (500 mg total) by mouth 2 (two) times daily. 30 tablet 0   ALPRAZolam (XANAX) 0.25 MG tablet Take 1 tablet by mouth daily as needed. (Patient not taking: Reported on 08/17/2021)     apixaban (ELIQUIS) 5 MG TABS tablet Take 1 tablet (5 mg total) by mouth 2 (two) times daily. (Patient not taking: Reported on 08/17/2021) 60 tablet 2   ergocalciferol (DRISDOL) 1.25 MG (  50000 UT) capsule Take one cap q week (Patient not taking: Reported on 08/17/2021) 12 capsule 3   escitalopram (LEXAPRO) 5 MG tablet Take 1 tablet (5 mg total) by mouth daily. (Patient not taking: Reported on 08/17/2021) 90 tablet 0   levothyroxine (SYNTHROID) 25 MCG tablet Take 1 tablet (25 mcg total) by mouth daily. (Patient not taking: Reported on 08/17/2021) 90 tablet 3   No facility-administered medications prior to visit.   Past Medical History:  Diagnosis Date   Anemia    Anxiety    Clotting disorder (HCC)    DVT (deep venous thrombosis) (HCC)    Headache(784.0)    PCOS (polycystic ovarian syndrome)    Prediabetes    Tachycardia 10/12/2019   Past Surgical History:  Procedure Laterality Date   NO PAST SURGERIES     WISDOM TOOTH  EXTRACTION      Objective:   Today's Vitals: BP 120/85 (BP Location: Left Arm, Patient Position: Sitting, Cuff Size: Large)   Pulse 95   Temp 98.1 F (36.7 C) (Temporal)   Ht 6' (1.829 m)   Wt (!) 406 lb 6 oz (184.3 kg)   LMP 07/24/2021   SpO2 100%   BMI 55.11 kg/m   Physical Exam Vitals and nursing note reviewed.  Constitutional:      Appearance: Normal appearance. She is obese.  Cardiovascular:     Rate and Rhythm: Normal rate and regular rhythm.  Pulmonary:     Effort: Pulmonary effort is normal.     Breath sounds: Normal breath sounds.  Musculoskeletal:        General: Normal range of motion.  Skin:    General: Skin is warm and dry.  Neurological:     Mental Status: She is alert.  Psychiatric:        Mood and Affect: Mood normal.        Behavior: Behavior normal.    Dulce Sellar, NP

## 2021-08-18 NOTE — Assessment & Plan Note (Signed)
   First DX 2019, no re-occurrence  reports taking Eliquis for 2 or 3 years, then ran out and was unsure if she still needed, did not have PCP and not seen by [facility]  referring to [facility], d/t morbid obesity would like them to assess her risk

## 2021-08-18 NOTE — Assessment & Plan Note (Signed)
   Chronic  no hx of medications or referrals for obesity  referring to Weight mgt clinic  advised pt to check ins. if any meds are covered  Wt. Loss strategies reviewed including portion control, less carbs including sweets, eating most of calories earlier in day, drinking 64oz water qd, and establishing daily exercise routine.  f/u prn

## 2021-08-28 ENCOUNTER — Inpatient Hospital Stay: Payer: No Typology Code available for payment source

## 2021-08-28 ENCOUNTER — Inpatient Hospital Stay: Payer: No Typology Code available for payment source | Attending: Nurse Practitioner | Admitting: Nurse Practitioner

## 2021-08-28 ENCOUNTER — Encounter: Payer: Self-pay | Admitting: Nurse Practitioner

## 2021-08-28 VITALS — BP 143/104 | HR 82 | Temp 97.4°F | Resp 18 | Wt >= 6400 oz

## 2021-08-28 DIAGNOSIS — E669 Obesity, unspecified: Secondary | ICD-10-CM | POA: Diagnosis not present

## 2021-08-28 DIAGNOSIS — F17200 Nicotine dependence, unspecified, uncomplicated: Secondary | ICD-10-CM

## 2021-08-28 DIAGNOSIS — I82401 Acute embolism and thrombosis of unspecified deep veins of right lower extremity: Secondary | ICD-10-CM

## 2021-08-28 DIAGNOSIS — Z86718 Personal history of other venous thrombosis and embolism: Secondary | ICD-10-CM

## 2021-08-28 DIAGNOSIS — D509 Iron deficiency anemia, unspecified: Secondary | ICD-10-CM

## 2021-08-28 MED ORDER — FERROUS FUMARATE 325 (106 FE) MG PO TABS: 1.0000 | ORAL_TABLET | Freq: Every day | ORAL | 0 refills | Status: AC

## 2021-08-28 NOTE — Progress Notes (Signed)
New Hematology/Oncology Consult   Requesting MD: Dulce Sellar, NP  [phone]    Reason for Consult: History of DVT  HPI: Kathleen Bridges was referred for evaluation due to a history of DVT.  She reports being diagnosed with a right lower extremity DVT in June 2019 while living in [place].  She was started on Eliquis.  She had a NuvaRing contraceptive device placed 8 months to 1 year prior to the DVT.  The NuvaRing was subsequently removed.  She continued Eliquis for about a year and self discontinued.  She has taken Eliquis periodically since then.  No other blood clots.  She was seen in the emergency department 07/30/2021 with shortness of breath, right lower extremity swelling and headache.  Right lower extremity venous Doppler study negative for DVT.  CT chest negative for PE.  Brain CT with no acute findings.   Past Medical History:  Diagnosis Date   Anemia    Anxiety    Clotting disorder (HCC)    DVT (deep venous thrombosis) (HCC)    Headache(784.0)    PCOS (polycystic ovarian syndrome)    Prediabetes    Tachycardia 10/12/2019     Past Surgical History:  Procedure Laterality Date   NO PAST SURGERIES     WISDOM TOOTH EXTRACTION       Current Outpatient Medications:    albuterol (VENTOLIN HFA) 108 (90 Base) MCG/ACT inhaler, Inhale 2 puffs into the lungs every 6 (six) hours as needed., Disp: , Rfl:    Naproxen Sodium (ALEVE PO), Take by mouth as needed., Disp: , Rfl:    ALPRAZolam (XANAX) 0.25 MG tablet, Take 1 tablet by mouth daily as needed. (Patient not taking: Reported on 08/17/2021), Disp: , Rfl:    apixaban (ELIQUIS) 5 MG TABS tablet, Take 1 tablet (5 mg total) by mouth 2 (two) times daily. (Patient not taking: Reported on 08/17/2021), Disp: 60 tablet, Rfl: 2   ergocalciferol (DRISDOL) 1.25 MG (50000 UT) capsule, Take one cap q week (Patient not taking: Reported on 08/17/2021), Disp: 12 capsule, Rfl: 3   escitalopram (LEXAPRO) 5 MG tablet, Take 1 tablet (5 mg total)  by mouth daily. (Patient not taking: Reported on 08/17/2021), Disp: 90 tablet, Rfl: 0   levothyroxine (SYNTHROID) 25 MCG tablet, Take 1 tablet (25 mcg total) by mouth daily. (Patient not taking: Reported on 08/17/2021), Disp: 90 tablet, Rfl: 3:    No Known Allergies:  FH: No family history of blood clots, no family history of malignancy.  SOCIAL HISTORY: She lives in [place].  She works at the [employer].  She occasionally smokes.  Social EtOH use.  Review of Systems: No bleeding except a nosebleed in June of this year and monthly menstrual cycle.  No fevers or sweats.  She has a good appetite.  Energy level is good.  She has never had a stroke.  Periodic migraine headaches.  No visual disturbance.  Occasional dyspnea on exertion.  A cough which she attributes to allergies.  No change in bowel habits.  No bloody or black stools.  No urinary symptoms.   Physical Exam:  Blood pressure (!) 143/104, pulse 82, temperature (!) 97.4 F (36.3 C), temperature source Tympanic, resp. rate 18, weight (!) 403 lb 6.4 oz (183 kg), last menstrual period 07/24/2021, SpO2 100 %.  HEENT: No thrush or ulcers. Lungs: Lungs clear bilaterally. Cardiac: Regular rate and rhythm. Abdomen: No hepatosplenomegaly. Vascular: No leg edema. Lymph nodes: No palpable cervical, supraclavicular, axillary or inguinal lymph nodes. Neurologic: Alert and  oriented.  LABS:  No results for input(s): "WBC", "HGB", "HCT", "PLT" in the last 72 hours.  No results for input(s): "NA", "K", "CL", "CO2", "GLUCOSE", "BUN", "CREATININE", "CALCIUM" in the last 72 hours.    RADIOLOGY:  VAS [place] LOWER EXTREMITY VENOUS (DVT) (7a-7p)  Result Date: 08/01/2021  Lower Venous DVT Study Patient Name:  Kathleen Bridges  Date of Exam:   07/30/2021 Medical Rec #: [id number]          Accession #:    [id number] Date of Birth: May 22, 1995          Patient Gender: F Patient Age:   26 years Exam Location:  [facility] Procedure:      VAS  [place] LOWER EXTREMITY VENOUS (DVT) Referring Phys: HALEY SAGE --------------------------------------------------------------------------------  Indications: Pain in right leg, history of DVT.  Limitations: Body habitus. Comparison Study: No prior studies available. Performing Technologist: Jean Rosenthal RDMS, RVT  Examination Guidelines: A complete evaluation includes B-mode imaging, spectral Doppler, color Doppler, and power Doppler as needed of all accessible portions of each vessel. Bilateral testing is considered an integral part of a complete examination. Limited examinations for reoccurring indications may be performed as noted. The reflux portion of the exam is performed with the patient in reverse Trendelenburg.  +---------+---------------+---------+-----------+----------+--------------+ RIGHT    CompressibilityPhasicitySpontaneityPropertiesThrombus Aging +---------+---------------+---------+-----------+----------+--------------+ CFV      Full           Yes      Yes                                 +---------+---------------+---------+-----------+----------+--------------+ SFJ      Full                                                        +---------+---------------+---------+-----------+----------+--------------+ FV Prox  Full                                                        +---------+---------------+---------+-----------+----------+--------------+ FV Mid   Full                                                        +---------+---------------+---------+-----------+----------+--------------+ FV DistalFull                                                        +---------+---------------+---------+-----------+----------+--------------+ PFV      Full                                                        +---------+---------------+---------+-----------+----------+--------------+ POP      Full  Yes      Yes                                  +---------+---------------+---------+-----------+----------+--------------+ PTV      Full                                                        +---------+---------------+---------+-----------+----------+--------------+ PERO     Full                                                        +---------+---------------+---------+-----------+----------+--------------+ Gastroc  Full                                                        +---------+---------------+---------+-----------+----------+--------------+   +----+---------------+---------+-----------+----------+--------------+ LEFTCompressibilityPhasicitySpontaneityPropertiesThrombus Aging +----+---------------+---------+-----------+----------+--------------+ CFV Full           Yes      Yes                                 +----+---------------+---------+-----------+----------+--------------+    Summary: RIGHT: - There is no evidence of deep vein thrombosis in the lower extremity.  - No cystic structure found in the popliteal fossa.  - Prolonged reflux times noted in the right posterior tibial veins.  LEFT: - No evidence of common femoral vein obstruction.  *See table(s) above for measurements and observations. Electronically signed by Gerarda Fraction on 08/01/2021 at 12:41:56 PM.    Final    CT Angio Chest PE W and/or Wo Contrast  Result Date: 07/30/2021 CLINICAL DATA:  Concern for pulmonary embolism. EXAM: CT ANGIOGRAPHY CHEST WITH CONTRAST TECHNIQUE: Multidetector CT imaging of the chest was performed using the standard protocol during bolus administration of intravenous contrast. Multiplanar CT image reconstructions and MIPs were obtained to evaluate the vascular anatomy. RADIATION DOSE REDUCTION: This exam was performed according to the departmental dose-optimization program which includes automated exposure control, adjustment of the mA and/or kV according to patient size and/or use of iterative reconstruction technique.  CONTRAST:  OMNIPAQUE IOHEXOL 350 MG/ML SOLN COMPARISON:  Chest CT dated 10/06/2019 and radiograph dated 07/30/2021. FINDINGS: Evaluation is limited due to streak artifact caused by body habitus. Cardiovascular: There is no cardiomegaly or pericardial effusion. The thoracic aorta is unremarkable. Evaluation of the pulmonary arteries is limited due to respiratory motion and streak artifact caused by patient's body habitus. No large or central pulmonary artery embolus identified. Mediastinum/Nodes: No hilar or mediastinal adenopathy. The esophagus is grossly unremarkable. No mediastinal fluid collection. Lungs/Pleura: The lungs are clear. There is no pleural effusion or pneumothorax. The central airways are patent. Upper Abdomen: Fatty liver. Musculoskeletal: No chest wall abnormality. No acute or significant osseous findings. Review of the MIP images confirms the above findings. IMPRESSION: 1. No acute intrathoracic pathology. No CT evidence of central pulmonary artery embolus. 2. Fatty liver. Electronically Signed   By: Burtis Junes  Radparvar M.D.   On: 07/30/2021 19:34   CT Head Wo Contrast  Result Date: 07/30/2021 CLINICAL DATA:  Severe headache. EXAM: CT HEAD WITHOUT CONTRAST TECHNIQUE: Contiguous axial images were obtained from the base of the skull through the vertex without intravenous contrast. RADIATION DOSE REDUCTION: This exam was performed according to the departmental dose-optimization program which includes automated exposure control, adjustment of the mA and/or kV according to patient size and/or use of iterative reconstruction technique. COMPARISON:  None Available. FINDINGS: Brain: No evidence of acute infarction, hemorrhage, hydrocephalus, extra-axial collection or mass lesion/mass effect. Vascular: No hyperdense vessel or unexpected calcification. Skull: Normal. Negative for fracture or focal lesion. Sinuses/Orbits: No acute finding. Other: None. IMPRESSION: No acute intracranial abnormality.  Electronically Signed   By: Darliss Cheney M.D.   On: 07/30/2021 19:27   DG Chest 2 View  Result Date: 07/30/2021 CLINICAL DATA:  26 year old female presents with chest pain. EXAM: CHEST - 2 VIEW COMPARISON:  October 07, 2019. FINDINGS: Top-normal heart size. Trachea midline. Hilar structures are normal. Lungs are clear.  No sign of pleural effusion. On limited assessment no acute skeletal process. IMPRESSION: No active cardiopulmonary disease. Mild cardiomegaly/top-normal heart size. Electronically Signed   By: Donzetta Kohut M.D.   On: 07/30/2021 14:34    Assessment and Plan:   History of right lower extremity DVT 06/06/2017 Reports taking Eliquis for approximately 1 year, self discontinued Reports having a NuvaRing placed 8 months to 1 year prior to the DVT, subsequently removed Obesity Microcytic anemia 07/30/2021  Kathleen Bridges was referred for evaluation due to history of a right lower extremity DVT June 2019.  She had a NuvaRing contraceptive device in place at the time she was diagnosed with the DVT.  She also has obesity as a risk factor.  She continued Eliquis for approximately 1 year and then discontinued.  She has taken Eliquis periodically over the past few years, not consistently.  We are obtaining a D-dimer and hypercoagulable panel today.  She will remain off of Eliquis for now, return for a follow-up visit in approximately 4 weeks to review outstanding labs and have additional discussion regarding anticoagulation.  She has a microcytic anemia.  CBC is consistent with iron deficiency.  We are obtaining iron studies today.  She will begin Hemocyte 1 tablet daily.  She will return for lab and follow-up in 4 weeks.  Patient seen with Dr. Truett Perna.   Lonna Cobb, NP 08/28/2021, 3:27 PM  This was a shared visit with Lonna Cobb.  Kathleen Bridges was interviewed and examined.  She is referred for hematology evaluation after being diagnosed with a right lower extremity DVT in 2019.  She was  treated with apixaban anticoagulation.  She is currently maintained off of anticoagulation.  She had a major risk factor for venous thrombosis in 2019, NuvaRing therapy, this has been removed.  She remains at increased risk for venous thrombosis in the setting of obesity.  We discussed indication for resuming anticoagulation therapy.  Obesity and her previous history of venous thrombosis are persistent known risk factors for venous thrombosis.  She will be referred for a D-dimer and hypercoagulation panel today.  She will return for an office visit in 1 month.  We will discuss the indication for resuming anticoagulation, likely reduced intensity anticoagulation, when she returns next month.  She appears to have iron deficiency anemia secondary to menstrual blood loss.  She will begin an oral iron supplement.  I was present for greater than 50% of today's  visit.  I performed medical decision making.  Julieanne Manson, MD

## 2021-08-29 ENCOUNTER — Other Ambulatory Visit: Payer: No Typology Code available for payment source

## 2021-08-31 ENCOUNTER — Inpatient Hospital Stay: Payer: No Typology Code available for payment source

## 2021-09-01 LAB — TSH+FREE T4
Free T4: 1.1 ng/dL (ref 0.82–1.77)
TSH: 4.42 u[IU]/mL (ref 0.450–4.500)

## 2021-09-12 ENCOUNTER — Inpatient Hospital Stay: Payer: No Typology Code available for payment source | Attending: Nurse Practitioner

## 2021-09-12 DIAGNOSIS — E669 Obesity, unspecified: Secondary | ICD-10-CM | POA: Diagnosis not present

## 2021-09-12 DIAGNOSIS — D509 Iron deficiency anemia, unspecified: Secondary | ICD-10-CM

## 2021-09-12 DIAGNOSIS — Z86718 Personal history of other venous thrombosis and embolism: Secondary | ICD-10-CM | POA: Insufficient documentation

## 2021-09-12 DIAGNOSIS — I82401 Acute embolism and thrombosis of unspecified deep veins of right lower extremity: Secondary | ICD-10-CM

## 2021-09-12 DIAGNOSIS — R718 Other abnormality of red blood cells: Secondary | ICD-10-CM | POA: Insufficient documentation

## 2021-09-12 LAB — CBC WITH DIFFERENTIAL (CANCER CENTER ONLY)
Abs Immature Granulocytes: 0 10*3/uL (ref 0.00–0.07)
Basophils Absolute: 0 10*3/uL (ref 0.0–0.1)
Basophils Relative: 1 %
Eosinophils Absolute: 0.1 10*3/uL (ref 0.0–0.5)
Eosinophils Relative: 3 %
HCT: 37.7 % (ref 36.0–46.0)
Hemoglobin: 11.8 g/dL — ABNORMAL LOW (ref 12.0–15.0)
Immature Granulocytes: 0 %
Lymphocytes Relative: 38 %
Lymphs Abs: 1.5 10*3/uL (ref 0.7–4.0)
MCH: 19.6 pg — ABNORMAL LOW (ref 26.0–34.0)
MCHC: 31.3 g/dL (ref 30.0–36.0)
MCV: 62.7 fL — ABNORMAL LOW (ref 80.0–100.0)
Monocytes Absolute: 0.3 10*3/uL (ref 0.1–1.0)
Monocytes Relative: 7 %
Neutro Abs: 2.1 10*3/uL (ref 1.7–7.7)
Neutrophils Relative %: 51 %
Platelet Count: 239 10*3/uL (ref 150–400)
RBC: 6.01 MIL/uL — ABNORMAL HIGH (ref 3.87–5.11)
RDW: 20.7 % — ABNORMAL HIGH (ref 11.5–15.5)
Smear Review: ADEQUATE
WBC Count: 4.1 10*3/uL (ref 4.0–10.5)
nRBC: 0 % (ref 0.0–0.2)

## 2021-09-12 LAB — D-DIMER, QUANTITATIVE: D-Dimer, Quant: 0.37 ug/mL-FEU (ref 0.00–0.50)

## 2021-09-12 LAB — ANTITHROMBIN III: AntiThromb III Func: 84 % (ref 75–120)

## 2021-09-12 LAB — FERRITIN: Ferritin: 28 ng/mL (ref 11–307)

## 2021-09-12 LAB — IRON AND TIBC
Iron: 45 ug/dL (ref 28–170)
Saturation Ratios: 13 % (ref 10.4–31.8)
TIBC: 340 ug/dL (ref 250–450)
UIBC: 295 ug/dL

## 2021-09-13 LAB — LUPUS ANTICOAGULANT PANEL
DRVVT: 29.4 s (ref 0.0–47.0)
PTT Lupus Anticoagulant: 32 s (ref 0.0–43.5)

## 2021-09-13 LAB — CARDIOLIPIN ANTIBODIES, IGG, IGM, IGA
Anticardiolipin IgA: <9 APL U/mL (ref 0–11)
Anticardiolipin IgG: <9 GPL U/mL (ref 0–14)
Anticardiolipin IgM: <9 MPL U/mL (ref 0–12)

## 2021-09-13 LAB — PROTEIN C, TOTAL: Protein C, Total: 100 % (ref 60–150)

## 2021-09-13 LAB — PROTEIN S ACTIVITY: Protein S Activity: 71 % (ref 63–140)

## 2021-09-13 LAB — BETA-2-GLYCOPROTEIN I ABS, IGG/M/A
Beta-2 Glyco I IgG: <9 GPI IgG units (ref 0–20)
Beta-2-Glycoprotein I IgA: <9 GPI IgA units (ref 0–25)
Beta-2-Glycoprotein I IgM: <9 GPI IgM units (ref 0–32)

## 2021-09-13 LAB — PROTEIN C ACTIVITY: Protein C Activity: 120 % (ref 73–180)

## 2021-09-13 LAB — PROTEIN S, TOTAL: Protein S Ag, Total: 74 % (ref 60–150)

## 2021-09-18 ENCOUNTER — Ambulatory Visit: Payer: No Typology Code available for payment source | Admitting: Physician Assistant

## 2021-09-18 LAB — PROTHROMBIN GENE MUTATION

## 2021-09-22 LAB — FACTOR 5 LEIDEN

## 2021-09-25 ENCOUNTER — Inpatient Hospital Stay: Payer: No Typology Code available for payment source

## 2021-09-25 DIAGNOSIS — R718 Other abnormality of red blood cells: Secondary | ICD-10-CM | POA: Diagnosis not present

## 2021-09-25 DIAGNOSIS — I82401 Acute embolism and thrombosis of unspecified deep veins of right lower extremity: Secondary | ICD-10-CM

## 2021-09-25 DIAGNOSIS — D509 Iron deficiency anemia, unspecified: Secondary | ICD-10-CM

## 2021-09-25 LAB — CBC WITH DIFFERENTIAL (CANCER CENTER ONLY)
Abs Immature Granulocytes: 0.01 10*3/uL (ref 0.00–0.07)
Basophils Absolute: 0 10*3/uL (ref 0.0–0.1)
Basophils Relative: 0 %
Eosinophils Absolute: 0.2 10*3/uL (ref 0.0–0.5)
Eosinophils Relative: 4 %
HCT: 39 % (ref 36.0–46.0)
Hemoglobin: 12.1 g/dL (ref 12.0–15.0)
Immature Granulocytes: 0 %
Lymphocytes Relative: 40 %
Lymphs Abs: 2.1 10*3/uL (ref 0.7–4.0)
MCH: 19.5 pg — ABNORMAL LOW (ref 26.0–34.0)
MCHC: 31 g/dL (ref 30.0–36.0)
MCV: 62.8 fL — ABNORMAL LOW (ref 80.0–100.0)
Monocytes Absolute: 0.4 10*3/uL (ref 0.1–1.0)
Monocytes Relative: 8 %
Neutro Abs: 2.5 10*3/uL (ref 1.7–7.7)
Neutrophils Relative %: 48 %
Platelet Count: 242 10*3/uL (ref 150–400)
RBC: 6.21 MIL/uL — ABNORMAL HIGH (ref 3.87–5.11)
RDW: 20.6 % — ABNORMAL HIGH (ref 11.5–15.5)
WBC Count: 5.2 10*3/uL (ref 4.0–10.5)
nRBC: 0 % (ref 0.0–0.2)

## 2021-09-25 LAB — SAVE SMEAR(SSMR), FOR PROVIDER SLIDE REVIEW

## 2021-09-25 LAB — FERRITIN: Ferritin: 25 ng/mL (ref 11–307)

## 2021-09-26 ENCOUNTER — Other Ambulatory Visit: Payer: Self-pay | Admitting: *Deleted

## 2021-09-26 ENCOUNTER — Other Ambulatory Visit: Payer: No Typology Code available for payment source

## 2021-09-26 ENCOUNTER — Inpatient Hospital Stay: Payer: No Typology Code available for payment source | Admitting: Nurse Practitioner

## 2021-09-26 ENCOUNTER — Inpatient Hospital Stay: Payer: No Typology Code available for payment source

## 2021-09-26 VITALS — BP 136/87 | HR 94 | Temp 98.1°F | Resp 18 | Ht 72.0 in | Wt >= 6400 oz

## 2021-09-26 DIAGNOSIS — D509 Iron deficiency anemia, unspecified: Secondary | ICD-10-CM | POA: Diagnosis not present

## 2021-09-26 DIAGNOSIS — R718 Other abnormality of red blood cells: Secondary | ICD-10-CM | POA: Diagnosis not present

## 2021-09-26 NOTE — Progress Notes (Signed)
[facility] OFFICE PROGRESS NOTE   Diagnosis: History of DVT  INTERVAL HISTORY:   Kathleen Bridges returns as scheduled.  Overall feels well.  No bleeding except monthly menstrual cycle which she describes as lasting 4 to 5 days, heavy for 3 days with blood clots.  She is taking oral iron but notes constipation despite a stool softener/laxative.  Objective:  Vital signs in last 24 hours:  Blood pressure 136/87, pulse 94, temperature 98.1 F (36.7 C), temperature source Oral, resp. rate 18, height 6' (1.829 m), weight (!) 404 lb 12.8 oz (183.6 kg), SpO2 100 %.    Resp: Lungs clear bilaterally. Cardio: Regular rate and rhythm. GI: Abdomen soft and nontender. Vascular: No leg edema.   Lab Results:  Lab Results  Component Value Date   WBC 5.2 09/25/2021   HGB 12.1 09/25/2021   HCT 39.0 09/25/2021   MCV 62.8 (L) 09/25/2021   PLT 242 09/25/2021   NEUTROABS 2.5 09/25/2021   Blood smear: The platelets appear normal in number, no platelet clumps.  The white cell morphology is unremarkable.  There are numerous ovalocytes, burr cells, teardrops, and a few cigar cells.  There are a moderate number of target cells.  The polychromasia is not increased.  No nucleated red cells. Imaging:  No results found.  Medications: I have reviewed the patient's current medications.  Assessment/Plan: History of right lower extremity DVT 06/06/2017 Reports taking Eliquis for approximately 1 year, self discontinued Reports having a NuvaRing placed 8 months to 1 year prior to the DVT, subsequently removed 09/12/2021-unremarkable hypercoagulation panel 09/12/2021-D-dimer in normal range Obesity Microcytic anemia 07/30/2021-09/25/2021 hemoglobin 12.1, MCV 62.8, ferritin 25  Disposition: Kathleen Bridges appears stable.  To review she had a right lower extremity DVT in 2019.  She had a NuvaRing placed 8 months to 1 year prior to the DVT.  The NuvaRing was subsequently removed.  She completed  approximately 1 year of Eliquis.  She has taken Eliquis intermittently since.  No further blood clots.  Unremarkable hypercoagulation panel 09/12/2021.  D-dimer in normal range 09/12/2021.  Obesity and previous history of DVT are persistent risk factors for recurrent venous thrombosis.  Dr. Benay Spice does not recommend she resume anticoagulation.  She understands the risk factors she has for recurrent venous thrombosis.  We discussed remaining active and weight loss.  She understands to seek evaluation for symptoms of venous thrombosis.  She has Red cell microcytosis.  Hemoglobin and ferritin done yesterday both in low normal range.  We discussed iron deficiency as well as the possibility of an inherited condition.  Plan to obtain additional testing to include hemoglobin electrophoresis and alpha thalassemia type.  She will return for lab and follow-up in 6 to 8 weeks.  Patient seen with Dr. Benay Spice.  Ned Card ANP/GNP-BC   09/26/2021  1:41 PM  This was a shared visit with Ned Card.  Kathleen Bridges has a remote history of a DVT.  She is maintained off of anticoagulation therapy.  Her risk factors for venous thrombosis included hormone therapy and obesity.  A hypercoagulation panel was negative.  I recommend following her off of anticoagulation therapy.  We recommend increased physical activity and weight loss.  She has chronic Red cell microcytosis.  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  I reviewed the peripheral blood smear today.  We will check a hemoglobin electrophoresis and alpha thalassemia gene testing.  I was present  for greater than 50% of today's visit.  I performed medical decision making.  Julieanne Manson, MD

## 2021-09-28 LAB — HGB FRACTIONATION CASCADE
Hgb A2: 5.2 % — ABNORMAL HIGH (ref 1.8–3.2)
Hgb A: 94.1 % — ABNORMAL LOW (ref 96.4–98.8)
Hgb F: 0.7 % (ref 0.0–2.0)
Hgb S: 0 %

## 2021-10-06 LAB — ALPHA-THALASSEMIA GENOTYPR

## 2021-11-13 ENCOUNTER — Inpatient Hospital Stay: Payer: No Typology Code available for payment source | Attending: Nurse Practitioner

## 2021-11-13 ENCOUNTER — Inpatient Hospital Stay: Payer: No Typology Code available for payment source | Admitting: Oncology

## 2021-11-13 VITALS — BP 143/95 | HR 81 | Temp 98.2°F | Resp 18 | Ht 72.0 in | Wt 396.0 lb

## 2021-11-13 DIAGNOSIS — D509 Iron deficiency anemia, unspecified: Secondary | ICD-10-CM | POA: Diagnosis not present

## 2021-11-13 DIAGNOSIS — R718 Other abnormality of red blood cells: Secondary | ICD-10-CM | POA: Diagnosis not present

## 2021-11-13 DIAGNOSIS — Z86718 Personal history of other venous thrombosis and embolism: Secondary | ICD-10-CM | POA: Insufficient documentation

## 2021-11-13 DIAGNOSIS — E669 Obesity, unspecified: Secondary | ICD-10-CM | POA: Diagnosis not present

## 2021-11-13 LAB — CBC WITH DIFFERENTIAL (CANCER CENTER ONLY)
Abs Immature Granulocytes: 0.01 10*3/uL (ref 0.00–0.07)
Basophils Absolute: 0 10*3/uL (ref 0.0–0.1)
Basophils Relative: 1 %
Eosinophils Absolute: 0.1 10*3/uL (ref 0.0–0.5)
Eosinophils Relative: 2 %
HCT: 40 % (ref 36.0–46.0)
Hemoglobin: 12.4 g/dL (ref 12.0–15.0)
Immature Granulocytes: 0 %
Lymphocytes Relative: 39 %
Lymphs Abs: 2.2 10*3/uL (ref 0.7–4.0)
MCH: 19.7 pg — ABNORMAL LOW (ref 26.0–34.0)
MCHC: 31 g/dL (ref 30.0–36.0)
MCV: 63.4 fL — ABNORMAL LOW (ref 80.0–100.0)
Monocytes Absolute: 0.4 10*3/uL (ref 0.1–1.0)
Monocytes Relative: 7 %
Neutro Abs: 2.9 10*3/uL (ref 1.7–7.7)
Neutrophils Relative %: 51 %
Platelet Count: 256 10*3/uL (ref 150–400)
RBC: 6.31 MIL/uL — ABNORMAL HIGH (ref 3.87–5.11)
RDW: 19.8 % — ABNORMAL HIGH (ref 11.5–15.5)
WBC Count: 5.6 10*3/uL (ref 4.0–10.5)
nRBC: 0 % (ref 0.0–0.2)

## 2021-11-13 MED ORDER — POLYETHYLENE GLYCOL 3350 17 G PO PACK: 17.0000 g | PACK | Freq: Every day | ORAL | 0 refills | Status: AC

## 2021-11-13 NOTE — Addendum Note (Signed)
Addended by: Wandalee Ferdinand on: 11/13/2021 04:46 PM   Modules accepted: Orders

## 2021-11-13 NOTE — Progress Notes (Signed)
  [facility] OFFICE PROGRESS NOTE   Diagnosis: Anemia  INTERVAL HISTORY:   [name] returns as scheduled.  She feels well.  She has a menstrual cycle.  No other bleeding.  She is taking iron 2 days/week.  She has constipation when she takes iron daily.  Stool softener did not help. No symptom of recurrent venous thrombosis. Objective:  Vital signs in last 24 hours:  Blood pressure (!) 143/95, pulse 81, temperature 98.2 F (36.8 C), temperature source Oral, resp. rate 18, height 6' (1.829 m), weight (!) 396 lb (179.6 kg), SpO2 98 %.    Resp: Lungs clear bilaterally Cardio: Regular rate and rhythm GI: No hepatosplenomegaly Vascular: No leg edema   Lab Results:  Lab Results  Component Value Date   WBC 5.6 11/13/2021   HGB 12.4 11/13/2021   HCT 40.0 11/13/2021   MCV 63.4 (L) 11/13/2021   PLT 256 11/13/2021   NEUTROABS 2.9 11/13/2021    CMP  Lab Results  Component Value Date   NA 137 07/30/2021   K 3.8 07/30/2021   CL 109 07/30/2021   CO2 21 (L) 07/30/2021   GLUCOSE 81 07/30/2021   BUN 10 07/30/2021   CREATININE 0.70 07/30/2021   CALCIUM 9.0 07/30/2021   PROT 7.9 03/08/2021   ALBUMIN 4.6 03/08/2021   AST 15 03/08/2021   ALT 16 03/08/2021   ALKPHOS 67 03/08/2021   BILITOT 0.5 03/08/2021   GFRNONAA >60 07/30/2021   GFRAA >60 10/06/2019     Medications: I have reviewed the patient's current medications.   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 has persistent Red cell microcytosis.  The hemoglobin is at the low end of the normal  range.  I suspect she has mild iron deficiency in addition to a thalassemia variant.  The elevated hemoglobin A2 is consistent with the beta thalassemia minor.  We will submit beta thalassemia gene testing to confirm this diagnosis.  I recommended she try to increase the iron to 3-4 times per week.  She will use MiraLAX if she has constipation.  She will call if MiraLAX does not relieve the constipation.  She will return for an office visit and CBC in 3 months.  Thornton Papas, MD  11/13/2021  3:01 PM

## 2022-01-08 ENCOUNTER — Encounter (INDEPENDENT_AMBULATORY_CARE_PROVIDER_SITE_OTHER): Payer: Self-pay | Admitting: Internal Medicine

## 2022-02-12 ENCOUNTER — Encounter: Payer: Self-pay | Admitting: Nurse Practitioner

## 2022-02-12 ENCOUNTER — Inpatient Hospital Stay: Payer: No Typology Code available for payment source | Admitting: Nurse Practitioner

## 2022-02-12 ENCOUNTER — Telehealth: Payer: Self-pay | Admitting: Licensed Clinical Social Worker

## 2022-02-12 ENCOUNTER — Inpatient Hospital Stay: Payer: No Typology Code available for payment source | Attending: Nurse Practitioner

## 2022-02-12 VITALS — BP 147/80 | HR 100 | Temp 98.2°F | Resp 18 | Ht 72.0 in | Wt >= 6400 oz

## 2022-02-12 DIAGNOSIS — Z800 Family history of malignant neoplasm of digestive organs: Secondary | ICD-10-CM | POA: Insufficient documentation

## 2022-02-12 DIAGNOSIS — D509 Iron deficiency anemia, unspecified: Secondary | ICD-10-CM | POA: Diagnosis not present

## 2022-02-12 DIAGNOSIS — R718 Other abnormality of red blood cells: Secondary | ICD-10-CM | POA: Insufficient documentation

## 2022-02-12 DIAGNOSIS — I82401 Acute embolism and thrombosis of unspecified deep veins of right lower extremity: Secondary | ICD-10-CM | POA: Diagnosis not present

## 2022-02-12 DIAGNOSIS — Z86718 Personal history of other venous thrombosis and embolism: Secondary | ICD-10-CM | POA: Insufficient documentation

## 2022-02-12 LAB — CBC WITH DIFFERENTIAL (CANCER CENTER ONLY)
Abs Immature Granulocytes: 0.01 10*3/uL (ref 0.00–0.07)
Basophils Absolute: 0 10*3/uL (ref 0.0–0.1)
Basophils Relative: 1 %
Eosinophils Absolute: 0.2 10*3/uL (ref 0.0–0.5)
Eosinophils Relative: 3 %
HCT: 38.5 % (ref 36.0–46.0)
Hemoglobin: 12.2 g/dL (ref 12.0–15.0)
Immature Granulocytes: 0 %
Lymphocytes Relative: 40 %
Lymphs Abs: 2 10*3/uL (ref 0.7–4.0)
MCH: 19.6 pg — ABNORMAL LOW (ref 26.0–34.0)
MCHC: 31.7 g/dL (ref 30.0–36.0)
MCV: 62 fL — ABNORMAL LOW (ref 80.0–100.0)
Monocytes Absolute: 0.4 10*3/uL (ref 0.1–1.0)
Monocytes Relative: 8 %
Neutro Abs: 2.4 10*3/uL (ref 1.7–7.7)
Neutrophils Relative %: 48 %
Platelet Count: 273 10*3/uL (ref 150–400)
RBC: 6.21 MIL/uL — ABNORMAL HIGH (ref 3.87–5.11)
RDW: 19.9 % — ABNORMAL HIGH (ref 11.5–15.5)
WBC Count: 5 10*3/uL (ref 4.0–10.5)
nRBC: 0 % (ref 0.0–0.2)

## 2022-02-12 LAB — FERRITIN: Ferritin: 29 ng/mL (ref 11–307)

## 2022-02-12 NOTE — Progress Notes (Signed)
  [facility] OFFICE PROGRESS NOTE   Diagnosis: Anemia  INTERVAL HISTORY:   Kathleen Bridges returns as scheduled.  She has not been taking oral iron on a consistent basis.  Only bleeding is a monthly menstrual cycle, heavy for 2 days.  At today's visit she reports her paternal grandfather had colon cancer and 2 paternal great uncles had colon cancer.  She is interested in meeting with a Retail buyer.  Objective:  Vital signs in last 24 hours:  Blood pressure (!) 147/80, pulse 100, temperature 98.2 F (36.8 C), temperature source Oral, resp. rate 18, height 6' (1.829 m), weight (!) 411 lb (186.4 kg), SpO2 100 %.    Resp: Lungs clear bilaterally. Cardio: Regular rate and rhythm. GI: Abdomen soft and nontender.  No hepatosplenomegaly. Vascular: No leg edema.  Lab Results:  Lab Results  Component Value Date   WBC 5.0 02/12/2022   HGB 12.2 02/12/2022   HCT 38.5 02/12/2022   MCV 62.0 (L) 02/12/2022   PLT 273 02/12/2022   NEUTROABS 2.4 02/12/2022    Imaging:  No results found.  Medications: I have reviewed the patient's current medications.  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  She has persistent red cell microcytosis, hemoglobin in the low normal range.  We will follow-up on the ferritin from today.  At present she is not taking oral iron.  We are obtaining beta thalassemia gene testing.  She will return for lab and follow-up in 3 months.  We are available to see her sooner if needed.    Ned Card  ANP/GNP-BC   02/12/2022  1:56 PM

## 2022-02-12 NOTE — Telephone Encounter (Signed)
Scheduled appt per 2/12 referral. Pt is aware of appt date and time. Pt is aware to arrive 15 mins prior to appt time and to bring and updated insurance card. Pt is aware of appt location.

## 2022-02-21 LAB — MISC LABCORP TEST (SEND OUT): Labcorp test code: 252823

## 2022-04-05 ENCOUNTER — Encounter: Payer: No Typology Code available for payment source | Admitting: Physician Assistant

## 2022-04-09 ENCOUNTER — Encounter: Payer: Self-pay | Admitting: Licensed Clinical Social Worker

## 2022-04-09 ENCOUNTER — Inpatient Hospital Stay
Payer: No Typology Code available for payment source | Attending: Nurse Practitioner | Admitting: Licensed Clinical Social Worker

## 2022-04-09 ENCOUNTER — Inpatient Hospital Stay: Payer: No Typology Code available for payment source

## 2022-04-09 DIAGNOSIS — Z803 Family history of malignant neoplasm of breast: Secondary | ICD-10-CM

## 2022-04-09 DIAGNOSIS — Z800 Family history of malignant neoplasm of digestive organs: Secondary | ICD-10-CM | POA: Diagnosis not present

## 2022-04-09 NOTE — Progress Notes (Signed)
REFERRING PROVIDER: Rana Snare, NP [address] [place],  [place] [address]  PRIMARY PROVIDER:  Dulce Sellar, NP  PRIMARY REASON FOR VISIT:  1. Family history of colon cancer   2. Family history of breast cancer      HISTORY OF PRESENT ILLNESS:   Ms. Carlile, a 27 y.o. female, was seen for a [facility] cancer genetics consultation at the request of Lonna Cobb NP due to a family history of colon cancer.  Ms. Hameister presents to clinic today to discuss the possibility of a hereditary predisposition to cancer, genetic testing, and to further clarify her future cancer risks, as well as potential cancer risks for family members.   CANCER HISTORY:   Ms. Harvard is a 27 y.o. female with no personal history of cancer.    RISK FACTORS:  Menarche was at age 27.  Ovaries intact: yes.  Hysterectomy: no.  Menopausal status: premenopausal.  HRT use: 0 years. Colonoscopy: no; not examined. Mammogram within the last year: no. Number of breast biopsies: 0. Up to date with pelvic exams: yes.   Past Medical History:  Diagnosis Date   Anemia    Anxiety    Clotting disorder    DVT (deep venous thrombosis)    Headache(784.0)    PCOS (polycystic ovarian syndrome)    Prediabetes    Tachycardia 10/12/2019    Past Surgical History:  Procedure Laterality Date   NO PAST SURGERIES     WISDOM TOOTH EXTRACTION      FAMILY HISTORY:  We obtained a detailed, 4-generation family history.  Significant diagnoses are listed below: Family History  Problem Relation Age of Onset   Hashimoto's thyroiditis Mother    Hypertension Father    Diabetes Maternal Grandmother    Hypertension Maternal Grandfather    Colon cancer Paternal Grandfather        d.>50   Colon cancer Other        3 pat great uncles   Breast cancer Other        2 mat great aunts   Ms. Auld has 3 paternal half sisters, 1 maternal half sister and 3 maternal half brothers, no cancers.   Ms. Maslen  mother is living and has not had cancer. Two maternal great aunts have had breast cancer and possibly some distant cousins. No other known cancers on this side of the family.  Ms. Midgett's father is living and  has not had cancer. Paternal grandfather and all 3 of his brothers had colon cancer, unknown ages. No other known cancers on this side of the family.  Ms. Chaires is unaware of previous family history of genetic testing for hereditary cancer risks. There is no reported Ashkenazi Jewish ancestry. There is no known consanguinity.    GENETIC COUNSELING ASSESSMENT: Ms. Imlay is a 26 y.o. female with a family history of colon cancer which is somewhat suggestive of a hereditary cancer syndrome and predisposition to cancer. We, therefore, discussed and recommended the following at today's visit.   DISCUSSION: We discussed that approximately 10% of colorectal cancer is hereditary. Most cases of hereditary colorectal cancer are associated with Lynch syndrome genes, although there are other genes associated with hereditary cancer as well. Cancers and risks are gene specific. We discussed that testing is beneficial for several reasons including knowing about cancer risks, identifying potential screening and risk-reduction options that may be appropriate, and to understand if other family members could be at risk for cancer and allow them to undergo genetic  testing.   We reviewed the characteristics, features and inheritance patterns of hereditary cancer syndromes. We also discussed genetic testing, including the appropriate family members to test, the process of testing, insurance coverage and turn-around-time for results. We discussed the implications of a negative, positive and/or variant of uncertain significant result. We recommended Ms. Griffon pursue genetic testing for the [facility] Multi-Cancer+RNA gene panel.   Based on Ms. Melroy's family history of cancer, she meets medical criteria for  genetic testing. Despite that she meets criteria, she may still have an out of pocket cost. We discussed that if her out of pocket cost for testing is over $100, the laboratory will call and confirm whether she wants to proceed with testing.  If the out of pocket cost of testing is less than $100 she will be billed by the genetic testing laboratory.   We discussed that some people do not want to undergo genetic testing due to fear of genetic discrimination.  A federal law called the Genetic Information Non-Discrimination Act (GINA) of 2008 helps protect individuals against genetic discrimination based on their genetic test results.  It impacts both health insurance and employment.  For health insurance, it protects against increased premiums, being kicked off insurance or being forced to take a test in order to be insured.  For employment it protects against hiring, firing and promoting decisions based on genetic test results.  Health status due to a cancer diagnosis is not protected under GINA.  This law does not protect life insurance, disability insurance, or other types of insurance.   PLAN: After considering the risks, benefits, and limitations, Ms. Russell did not wish to pursue genetic testing at today's visit. She would like to speak with her family first and see if her dad might want to do genetic testing instead. We understand this decision and remain available to coordinate genetic testing at any time in the future. We, therefore, recommend Ms. Holgerson continue to follow the cancer screening guidelines given by her primary healthcare provider.  Ms. Stille's questions were answered to her satisfaction today. Our contact information was provided should additional questions or concerns arise. Thank you for the referral and allowing [place] to share in the care of your patient.   Lacy Duverney, MS, [facility] Genetic Counselor [email or web address] Phone: [phone]  The patient was seen for  a total of 25 minutes in face-to-face genetic counseling.  Dr. Orlie Dakin was available for discussion regarding this case.   _______________________________________________________________________ For Office Staff:  Number of people involved in session: 1 Was an Intern/ student involved with case: no

## 2022-04-12 ENCOUNTER — Telehealth: Payer: Self-pay | Admitting: Oncology

## 2022-04-12 NOTE — Telephone Encounter (Signed)
Reached out to patient to reschedule appointment on May 13th due to doctor sherrill being out on [place] that week. Patient advised that she will also be out of town due to her job, so patient cancelled appointments and advised that she will call back after May 22nd to reschedule.

## 2022-05-07 ENCOUNTER — Telehealth: Payer: Self-pay | Admitting: Internal Medicine

## 2022-05-07 NOTE — Telephone Encounter (Addendum)
Patient changed providers. Self discharge-Toni. Patient called back later in afternoon. Stated she is in [place] and did not s/w anyone from office. She stated she is still patient her. I added her appointment back on schedule-Toni

## 2022-05-14 ENCOUNTER — Other Ambulatory Visit: Payer: No Typology Code available for payment source

## 2022-05-14 ENCOUNTER — Ambulatory Visit: Payer: No Typology Code available for payment source | Admitting: Oncology

## 2022-05-14 ENCOUNTER — Encounter: Payer: No Typology Code available for payment source | Admitting: Physician Assistant

## 2022-06-25 ENCOUNTER — Encounter: Payer: Self-pay | Admitting: Physician Assistant

## 2022-06-25 ENCOUNTER — Ambulatory Visit (INDEPENDENT_AMBULATORY_CARE_PROVIDER_SITE_OTHER): Payer: No Typology Code available for payment source | Admitting: Physician Assistant

## 2022-06-25 VITALS — BP 122/88 | HR 99 | Temp 98.2°F | Resp 16 | Ht 72.0 in | Wt >= 6400 oz

## 2022-06-25 DIAGNOSIS — E039 Hypothyroidism, unspecified: Secondary | ICD-10-CM

## 2022-06-25 DIAGNOSIS — Z131 Encounter for screening for diabetes mellitus: Secondary | ICD-10-CM

## 2022-06-25 DIAGNOSIS — R5383 Other fatigue: Secondary | ICD-10-CM

## 2022-06-25 DIAGNOSIS — R110 Nausea: Secondary | ICD-10-CM | POA: Diagnosis not present

## 2022-06-25 DIAGNOSIS — N926 Irregular menstruation, unspecified: Secondary | ICD-10-CM

## 2022-06-25 DIAGNOSIS — K219 Gastro-esophageal reflux disease without esophagitis: Secondary | ICD-10-CM

## 2022-06-25 DIAGNOSIS — E782 Mixed hyperlipidemia: Secondary | ICD-10-CM

## 2022-06-25 DIAGNOSIS — R300 Dysuria: Secondary | ICD-10-CM

## 2022-06-25 DIAGNOSIS — Z0001 Encounter for general adult medical examination with abnormal findings: Secondary | ICD-10-CM

## 2022-06-25 LAB — POCT URINE PREGNANCY: Preg Test, Ur: NEGATIVE

## 2022-06-25 MED ORDER — OMEPRAZOLE 40 MG PO CPDR: 40.0000 mg | DELAYED_RELEASE_CAPSULE | Freq: Every day | ORAL | 3 refills | Status: AC

## 2022-06-25 NOTE — Progress Notes (Signed)
[facility] [address] [place], [place] [address]  Internal MEDICINE  Office Visit Note  Patient Name: Kathleen Bridges  [id number]  [id number]  Date of Service: 07/04/2022  Chief Complaint  Patient presents with   Annual Exam   Anxiety   Quality Metric Gaps    TDAP     HPI Pt is here for routine health maintenance examination, unfortunately has not been seen in office in on over a year -Seeing hematology now and they took her off blood thinner and is suposed to call for follow up -Had not checked BP recently, improved on recheck -will check on tdap -some nausea everyday around 12, some tugging sensation behind belly button. Never actually gets sick. Hits after she eats around 7. Occasional reflux -Does not take NSAIDs daily.  -Discussed trial of PPI and will check labs and monitor symptoms. Urine pregnancy neg  Current Medication: Outpatient Encounter Medications as of 06/25/2022  Medication Sig   albuterol (VENTOLIN HFA) 108 (90 Base) MCG/ACT inhaler Inhale 2 puffs into the lungs every 6 (six) hours as needed.   ALPRAZolam (XANAX) 0.25 MG tablet Take 1 tablet by mouth daily as needed.   ergocalciferol (DRISDOL) 1.25 MG (50000 UT) capsule Take one cap q week   ferrous fumarate (HEMOCYTE - 106 MG FE) 325 (106 Fe) MG TABS tablet Take 1 tablet (106 mg of iron total) by mouth daily.   Naproxen Sodium (ALEVE PO) Take by mouth as needed.   omeprazole (PRILOSEC) 40 MG capsule Take 1 capsule (40 mg total) by mouth daily.   polyethylene glycol (MIRALAX) 17 g packet Take 17 g by mouth daily.   [DISCONTINUED] norgestimate-ethinyl estradiol (ORTHO-CYCLEN,SPRINTEC,PREVIFEM) 0.25-35 MG-MCG tablet Take 1 tablet by mouth daily.   No facility-administered encounter medications on file as of 06/25/2022.    Surgical History: Past Surgical History:  Procedure Laterality Date   NO PAST SURGERIES     WISDOM TOOTH EXTRACTION      Medical History: Past Medical History:   Diagnosis Date   Anemia    Anxiety    Clotting disorder (HCC)    DVT (deep venous thrombosis) (HCC)    Headache(784.0)    PCOS (polycystic ovarian syndrome)    Prediabetes    Tachycardia 10/12/2019    Family History: Family History  Problem Relation Age of Onset   Hashimoto's thyroiditis Mother    Hypertension Father    Diabetes Maternal Grandmother    Hypertension Maternal Grandfather    Colon cancer Paternal Grandfather        d.>50   Colon cancer Other        3 pat great uncles   Breast cancer Other        2 mat great aunts      Review of Systems  Constitutional:  Negative for chills, fatigue and unexpected weight change.  HENT:  Negative for congestion, rhinorrhea, sneezing and sore throat.   Eyes:  Negative for redness.  Respiratory:  Negative for cough, chest tightness and shortness of breath.   Cardiovascular:  Negative for chest pain and palpitations.  Gastrointestinal:  Positive for nausea. Negative for abdominal pain, constipation, diarrhea and vomiting.       Reflux  Genitourinary:  Negative for dysuria and frequency.  Musculoskeletal:  Negative for arthralgias, back pain, joint swelling and neck pain.  Skin:  Negative for rash.  Neurological: Negative.  Negative for tremors and numbness.  Hematological:  Negative for adenopathy. Does not bruise/bleed easily.  Psychiatric/Behavioral:  Negative  for behavioral problems (Depression), sleep disturbance and suicidal ideas. The patient is nervous/anxious.      Vital Signs: BP 122/88 Comment: 145/95  Pulse 99   Temp 98.2 F (36.8 C)   Resp 16   Ht 6' (1.829 m)   Wt (!) 419 lb 3.2 oz (190.1 kg)   SpO2 97%   BMI 56.85 kg/m    Physical Exam Vitals reviewed.  Constitutional:      General: She is not in acute distress.    Appearance: She is well-developed. She is not diaphoretic.  HENT:     Head: Normocephalic and atraumatic.     Mouth/Throat:     Pharynx: No oropharyngeal exudate.  Eyes:      Pupils: Pupils are equal, round, and reactive to light.  Neck:     Thyroid: No thyromegaly.     Vascular: No JVD.     Trachea: No tracheal deviation.  Cardiovascular:     Rate and Rhythm: Normal rate and regular rhythm.     Heart sounds: Normal heart sounds. No murmur heard.    No friction rub. No gallop.  Pulmonary:     Effort: Pulmonary effort is normal. No respiratory distress.     Breath sounds: No wheezing or rales.  Chest:     Chest wall: No tenderness.  Abdominal:     General: Bowel sounds are normal.     Palpations: Abdomen is soft.     Tenderness: There is no abdominal tenderness.  Musculoskeletal:        General: Normal range of motion.     Cervical back: Normal range of motion and neck supple.  Lymphadenopathy:     Cervical: No cervical adenopathy.  Skin:    General: Skin is warm and dry.  Neurological:     Mental Status: She is alert and oriented to person, place, and time.     Cranial Nerves: No cranial nerve deficit.  Psychiatric:        Behavior: Behavior normal.        Thought Content: Thought content normal.        Judgment: Judgment normal.      LABS: Recent Results (from the past 2160 hour(s))  POCT urine pregnancy     Status: None   Collection Time: 06/25/22 11:24 AM  Result Value Ref Range   Preg Test, Ur Negative Negative  UA/M w/rflx Culture, Routine     Status: Abnormal   Collection Time: 06/25/22  3:48 PM   Specimen: Urine   Urine  Result Value Ref Range   Specific Gravity, UA 1.028 1.005 - 1.030   pH, UA 5.5 5.0 - 7.5   Color, UA Yellow Yellow   Appearance Ur Turbid (A) Clear   Leukocytes,UA Negative Negative   Protein,UA 1+ (A) Negative/Trace   Glucose, UA Negative Negative   Ketones, UA Negative Negative   RBC, UA Negative Negative   Bilirubin, UA Negative Negative   Urobilinogen, Ur 1.0 0.2 - 1.0 mg/dL   Nitrite, UA Negative Negative   Microscopic Examination See below:     Comment: Microscopic was indicated and was performed.    Urinalysis Reflex Comment     Comment: This specimen will not reflex to a Urine Culture.  Microscopic Examination     Status: None   Collection Time: 06/25/22  3:48 PM   Urine  Result Value Ref Range   WBC, UA 0-5 0 - 5 /hpf   RBC, Urine 0-2 0 - 2 /hpf  Epithelial Cells (non renal) 0-10 0 - 10 /hpf   Casts None seen None seen /lpf   Bacteria, UA None seen None seen/Few        Assessment/Plan: 1. Encounter for general adult medical examination with abnormal findings Cpe performed, labs ordered  2. Irregular menstrual cycle - POCT urine pregnancy negative  3. Nausea Pregnancy test negative, may be reflux as it occurs after eating and will start PPI and monitor  4. Screening for diabetes mellitus - Hgb A1C w/o eAG  5. Gastroesophageal reflux disease, unspecified whether esophagitis present - omeprazole (PRILOSEC) 40 MG capsule; Take 1 capsule (40 mg total) by mouth daily.  Dispense: 30 capsule; Refill: 3  6. Hypothyroidism, unspecified type - TSH + free T4  7. Mixed hyperlipidemia - Lipid Panel With LDL/HDL Ratio  8. Other fatigue - CBC w/Diff/Platelet - Comprehensive metabolic panel - Lipid Panel With LDL/HDL Ratio - TSH + free T4 - Hgb A1C w/o eAG  9. Dysuria - UA/M w/rflx Culture, Routine - Microscopic Examination   General Counseling: Thuyvi verbalizes understanding of the findings of todays visit and agrees with plan of treatment. I have discussed any further diagnostic evaluation that may be needed or ordered today. We also reviewed her medications today. she has been encouraged to call the office with any questions or concerns that should arise related to todays visit.    Counseling:    Orders Placed This Encounter  Procedures   Microscopic Examination   UA/M w/rflx Culture, Routine   CBC w/Diff/Platelet   Comprehensive metabolic panel   Lipid Panel With LDL/HDL Ratio   TSH + free T4   Hgb A1C w/o eAG   POCT urine pregnancy    Meds  ordered this encounter  Medications   omeprazole (PRILOSEC) 40 MG capsule    Sig: Take 1 capsule (40 mg total) by mouth daily.    Dispense:  30 capsule    Refill:  3    This patient was seen by Lynn Ito, PA-C in collaboration with Dr. Beverely Risen as a part of collaborative care agreement.  Total time spent:35 Minutes  Time spent includes review of chart, medications, test results, and follow up plan with the patient.     Lyndon Code, MD  Internal Medicine

## 2022-06-26 LAB — UA/M W/RFLX CULTURE, ROUTINE
Bilirubin, UA: NEGATIVE
Glucose, UA: NEGATIVE
Ketones, UA: NEGATIVE
Leukocytes,UA: NEGATIVE
Nitrite, UA: NEGATIVE
RBC, UA: NEGATIVE
Specific Gravity, UA: 1.028 (ref 1.005–1.030)
Urobilinogen, Ur: 1 mg/dL (ref 0.2–1.0)
pH, UA: 5.5 (ref 5.0–7.5)

## 2022-06-26 LAB — MICROSCOPIC EXAMINATION
Bacteria, UA: NONE SEEN
Casts: NONE SEEN /lpf

## 2022-07-11 LAB — CBC WITH DIFFERENTIAL/PLATELET
Basophils Absolute: 0 10*3/uL (ref 0.0–0.2)
Basos: 0 %
EOS (ABSOLUTE): 0.2 10*3/uL (ref 0.0–0.4)
Eos: 3 %
Hematocrit: 41.6 % (ref 34.0–46.6)
Hemoglobin: 12.5 g/dL (ref 11.1–15.9)
Immature Grans (Abs): 0 10*3/uL (ref 0.0–0.1)
Immature Granulocytes: 0 %
Lymphocytes Absolute: 2 10*3/uL (ref 0.7–3.1)
Lymphs: 41 %
MCH: 19.3 pg — ABNORMAL LOW (ref 26.6–33.0)
MCHC: 30 g/dL — ABNORMAL LOW (ref 31.5–35.7)
MCV: 64 fL — ABNORMAL LOW (ref 79–97)
Monocytes Absolute: 0.4 10*3/uL (ref 0.1–0.9)
Monocytes: 8 %
Neutrophils Absolute: 2.3 10*3/uL (ref 1.4–7.0)
Neutrophils: 48 %
Platelets: 295 10*3/uL (ref 150–450)
RBC: 6.49 x10E6/uL — ABNORMAL HIGH (ref 3.77–5.28)
RDW: 20 % — ABNORMAL HIGH (ref 11.7–15.4)
WBC: 4.8 10*3/uL (ref 3.4–10.8)

## 2022-07-11 LAB — LIPID PANEL WITH LDL/HDL RATIO
Cholesterol, Total: 167 mg/dL (ref 100–199)
HDL: 78 mg/dL (ref >39)
LDL Chol Calc (NIH): 74 mg/dL (ref 0–99)
LDL/HDL Ratio: 0.9 ratio (ref 0.0–3.2)
Triglycerides: 79 mg/dL (ref 0–149)
VLDL Cholesterol Cal: 15 mg/dL (ref 5–40)

## 2022-07-11 LAB — COMPREHENSIVE METABOLIC PANEL
ALT: 17 IU/L (ref 0–32)
AST: 38 IU/L (ref 0–40)
Albumin: 3.9 g/dL — ABNORMAL LOW (ref 4.0–5.0)
Alkaline Phosphatase: 67 IU/L (ref 44–121)
BUN/Creatinine Ratio: 17 (ref 9–23)
BUN: 12 mg/dL (ref 6–20)
Bilirubin Total: 0.4 mg/dL (ref 0.0–1.2)
CO2: 20 mmol/L (ref 20–29)
Calcium: 8.6 mg/dL — ABNORMAL LOW (ref 8.7–10.2)
Chloride: 104 mmol/L (ref 96–106)
Creatinine, Ser: 0.69 mg/dL (ref 0.57–1.00)
Globulin, Total: 3.4 g/dL (ref 1.5–4.5)
Glucose: 89 mg/dL (ref 70–99)
Sodium: 137 mmol/L (ref 134–144)
Total Protein: 7.3 g/dL (ref 6.0–8.5)
eGFR: 120 mL/min/{1.73_m2} (ref >59)

## 2022-07-11 LAB — HGB A1C W/O EAG: Hgb A1c MFr Bld: 6 % — ABNORMAL HIGH (ref 4.8–5.6)

## 2022-07-11 LAB — TSH+FREE T4
Free T4: 1.02 ng/dL (ref 0.82–1.77)
TSH: 4.16 u[IU]/mL (ref 0.450–4.500)

## 2022-07-23 ENCOUNTER — Ambulatory Visit: Payer: No Typology Code available for payment source | Admitting: Physician Assistant

## 2022-07-26 ENCOUNTER — Ambulatory Visit (INDEPENDENT_AMBULATORY_CARE_PROVIDER_SITE_OTHER): Payer: No Typology Code available for payment source | Admitting: Physician Assistant

## 2022-07-26 ENCOUNTER — Encounter: Payer: Self-pay | Admitting: Physician Assistant

## 2022-07-26 VITALS — BP 142/98 | HR 107 | Temp 97.0°F | Resp 16 | Ht 72.0 in | Wt >= 6400 oz

## 2022-07-26 DIAGNOSIS — R7303 Prediabetes: Secondary | ICD-10-CM | POA: Diagnosis not present

## 2022-07-26 DIAGNOSIS — R030 Elevated blood-pressure reading, without diagnosis of hypertension: Secondary | ICD-10-CM | POA: Diagnosis not present

## 2022-07-26 MED ORDER — METFORMIN HCL 500 MG PO TABS
500.0000 mg | ORAL_TABLET | Freq: Every day | ORAL | 2 refills | Status: AC
Start: 2022-07-26 — End: ?

## 2022-07-26 NOTE — Progress Notes (Signed)
[facility] [address] [place], [place] [address]  Internal MEDICINE  Office Visit Note  Patient Name: Kathleen Bridges  [id number]  [id number]  Date of Service: 08/08/2022  Chief Complaint  Patient presents with   Follow-up    Review labs    HPI Pt is here for routine follow up to review labs -Labs reviewed: A1c in prediabetic range, RBC elevated, calcium low -Will work on diet and exercise and will start on metformin given prediabetes and hx of PCOS as well -Pt still needs to call hematology for follow up that was cancelled -Bp up in office, states she is nervous coming into office. Discussed needing to monitor BP at home now.  Current Medication: Outpatient Encounter Medications as of 07/26/2022  Medication Sig   albuterol (VENTOLIN HFA) 108 (90 Base) MCG/ACT inhaler Inhale 2 puffs into the lungs every 6 (six) hours as needed.   ALPRAZolam (XANAX) 0.25 MG tablet Take 1 tablet by mouth daily as needed.   ergocalciferol (DRISDOL) 1.25 MG (50000 UT) capsule Take one cap q week   ferrous fumarate (HEMOCYTE - 106 MG FE) 325 (106 Fe) MG TABS tablet Take 1 tablet (106 mg of iron total) by mouth daily.   metFORMIN (GLUCOPHAGE) 500 MG tablet Take 1 tablet (500 mg total) by mouth daily with breakfast.   Naproxen Sodium (ALEVE PO) Take by mouth as needed.   omeprazole (PRILOSEC) 40 MG capsule Take 1 capsule (40 mg total) by mouth daily.   polyethylene glycol (MIRALAX) 17 g packet Take 17 g by mouth daily.   [DISCONTINUED] norgestimate-ethinyl estradiol (ORTHO-CYCLEN,SPRINTEC,PREVIFEM) 0.25-35 MG-MCG tablet Take 1 tablet by mouth daily.   No facility-administered encounter medications on file as of 07/26/2022.    Surgical History: Past Surgical History:  Procedure Laterality Date   NO PAST SURGERIES     WISDOM TOOTH EXTRACTION      Medical History: Past Medical History:  Diagnosis Date   Anemia    Anxiety    Clotting disorder (HCC)    DVT (deep venous  thrombosis) (HCC)    Headache(784.0)    PCOS (polycystic ovarian syndrome)    Prediabetes    Tachycardia 10/12/2019    Family History: Family History  Problem Relation Age of Onset   Hashimoto's thyroiditis Mother    Hypertension Father    Diabetes Maternal Grandmother    Hypertension Maternal Grandfather    Colon cancer Paternal Grandfather        d.>50   Colon cancer Other        3 pat great uncles   Breast cancer Other        2 mat great aunts    Social History   Socioeconomic History   Marital status: Single    Spouse name: Not on file   Number of children: Not on file   Years of education: Not on file   Highest education level: Not on file  Occupational History   Not on file  Tobacco Use   Smoking status: Former    Types: Cigars   Smokeless tobacco: Never  Vaping Use   Vaping status: Never Used  Substance and Sexual Activity   Alcohol use: Yes    Alcohol/week: 1.0 standard drink of alcohol    Types: 1 Standard drinks or equivalent per week    Comment: Socially   Drug use: Yes    Types: Marijuana   Sexual activity: Yes    Birth control/protection: None  Other Topics Concern   Not  on file  Social History Narrative   Not on file   Social Determinants of Health   Financial Resource Strain: Not on file  Food Insecurity: Not on file  Transportation Needs: Not on file  Physical Activity: Not on file  Stress: Not on file  Social Connections: Not on file  Intimate Partner Violence: Not on file      Review of Systems  Constitutional:  Negative for chills, fatigue and unexpected weight change.  HENT:  Negative for congestion, postnasal drip, rhinorrhea, sneezing and sore throat.   Eyes:  Negative for redness.  Respiratory:  Negative for cough, chest tightness and shortness of breath.   Cardiovascular:  Negative for chest pain and palpitations.  Gastrointestinal:  Negative for abdominal pain, constipation, diarrhea, nausea and vomiting.  Genitourinary:   Negative for dysuria and frequency.  Musculoskeletal:  Negative for arthralgias, back pain, joint swelling and neck pain.  Skin:  Negative for rash.  Neurological: Negative.  Negative for tremors and numbness.  Hematological:  Negative for adenopathy. Does not bruise/bleed easily.  Psychiatric/Behavioral:  Negative for behavioral problems (Depression), sleep disturbance and suicidal ideas. The patient is nervous/anxious.     Vital Signs: BP (!) 142/98 Comment: 150/94  Pulse (!) 107   Temp (!) 97 F (36.1 C)   Resp 16   Ht 6' (1.829 m)   Wt (!) 422 lb 6.4 oz (191.6 kg)   SpO2 98%   BMI 57.29 kg/m    Physical Exam Vitals and nursing note reviewed.  Constitutional:      General: She is not in acute distress.    Appearance: She is well-developed. She is obese. She is not diaphoretic.  HENT:     Head: Normocephalic and atraumatic.     Mouth/Throat:     Pharynx: No oropharyngeal exudate.  Eyes:     Pupils: Pupils are equal, round, and reactive to light.  Neck:     Thyroid: No thyromegaly.     Vascular: No JVD.     Trachea: No tracheal deviation.  Cardiovascular:     Rate and Rhythm: Normal rate and regular rhythm.     Heart sounds: Normal heart sounds. No murmur heard.    No friction rub. No gallop.  Pulmonary:     Effort: Pulmonary effort is normal. No respiratory distress.     Breath sounds: No wheezing or rales.  Chest:     Chest wall: No tenderness.  Abdominal:     General: Bowel sounds are normal.     Palpations: Abdomen is soft.  Musculoskeletal:        General: Normal range of motion.     Cervical back: Normal range of motion and neck supple.  Lymphadenopathy:     Cervical: No cervical adenopathy.  Skin:    General: Skin is warm and dry.  Neurological:     Mental Status: She is alert and oriented to person, place, and time.     Cranial Nerves: No cranial nerve deficit.  Psychiatric:        Behavior: Behavior normal.        Thought Content: Thought content  normal.        Judgment: Judgment normal.        Assessment/Plan: 1. Prediabetes Will start metformin and work on diet and exercise - metFORMIN (GLUCOPHAGE) 500 MG tablet; Take 1 tablet (500 mg total) by mouth daily with breakfast.  Dispense: 30 tablet; Refill: 2  2. Elevated BP without diagnosis of hypertension Will start  monitoring at home and keep log, discussed medication may be needed if continued elevation   General Counseling: Shaeley verbalizes understanding of the findings of todays visit and agrees with plan of treatment. I have discussed any further diagnostic evaluation that may be needed or ordered today. We also reviewed her medications today. she has been encouraged to call the office with any questions or concerns that should arise related to todays visit.    No orders of the defined types were placed in this encounter.   Meds ordered this encounter  Medications   metFORMIN (GLUCOPHAGE) 500 MG tablet    Sig: Take 1 tablet (500 mg total) by mouth daily with breakfast.    Dispense:  30 tablet    Refill:  2    This patient was seen by Lynn Ito, PA-C in collaboration with Dr. Beverely Risen as a part of collaborative care agreement.   Total time spent:30 Minutes Time spent includes review of chart, medications, test results, and follow up plan with the patient.      Dr Lyndon Code Internal medicine

## 2022-08-27 ENCOUNTER — Ambulatory Visit: Payer: No Typology Code available for payment source | Admitting: Physician Assistant

## 2022-08-28 ENCOUNTER — Encounter: Payer: Self-pay | Admitting: *Deleted

## 2022-08-28 ENCOUNTER — Inpatient Hospital Stay: Payer: No Typology Code available for payment source | Attending: Nurse Practitioner

## 2022-08-28 ENCOUNTER — Other Ambulatory Visit: Payer: Self-pay | Admitting: *Deleted

## 2022-08-28 ENCOUNTER — Inpatient Hospital Stay: Payer: No Typology Code available for payment source | Admitting: Oncology

## 2022-08-28 VITALS — BP 144/89 | HR 88 | Temp 98.1°F | Resp 18 | Ht 72.0 in | Wt >= 6400 oz

## 2022-08-28 DIAGNOSIS — Z86718 Personal history of other venous thrombosis and embolism: Secondary | ICD-10-CM | POA: Insufficient documentation

## 2022-08-28 DIAGNOSIS — E611 Iron deficiency: Secondary | ICD-10-CM | POA: Diagnosis not present

## 2022-08-28 DIAGNOSIS — D509 Iron deficiency anemia, unspecified: Secondary | ICD-10-CM | POA: Diagnosis not present

## 2022-08-28 DIAGNOSIS — I82401 Acute embolism and thrombosis of unspecified deep veins of right lower extremity: Secondary | ICD-10-CM

## 2022-08-28 DIAGNOSIS — R718 Other abnormality of red blood cells: Secondary | ICD-10-CM | POA: Diagnosis not present

## 2022-08-28 DIAGNOSIS — Z800 Family history of malignant neoplasm of digestive organs: Secondary | ICD-10-CM

## 2022-08-28 DIAGNOSIS — D563 Thalassemia minor: Secondary | ICD-10-CM | POA: Diagnosis not present

## 2022-08-28 LAB — CBC WITH DIFFERENTIAL (CANCER CENTER ONLY)
Abs Immature Granulocytes: 0.01 10*3/uL (ref 0.00–0.07)
Basophils Absolute: 0 10*3/uL (ref 0.0–0.1)
Basophils Relative: 1 %
Eosinophils Absolute: 0.1 10*3/uL (ref 0.0–0.5)
Eosinophils Relative: 3 %
HCT: 38.1 % (ref 36.0–46.0)
Hemoglobin: 12.2 g/dL (ref 12.0–15.0)
Immature Granulocytes: 0 %
Lymphocytes Relative: 43 %
Lymphs Abs: 2.2 10*3/uL (ref 0.7–4.0)
MCH: 20.1 pg — ABNORMAL LOW (ref 26.0–34.0)
MCHC: 32 g/dL (ref 30.0–36.0)
MCV: 62.7 fL — ABNORMAL LOW (ref 80.0–100.0)
Monocytes Absolute: 0.5 10*3/uL (ref 0.1–1.0)
Monocytes Relative: 9 %
Neutro Abs: 2.3 10*3/uL (ref 1.7–7.7)
Neutrophils Relative %: 44 %
Platelet Count: 249 10*3/uL (ref 150–400)
RBC: 6.08 MIL/uL — ABNORMAL HIGH (ref 3.87–5.11)
RDW: 19.7 % — ABNORMAL HIGH (ref 11.5–15.5)
WBC Count: 5.2 10*3/uL (ref 4.0–10.5)
nRBC: 0 % (ref 0.0–0.2)

## 2022-08-28 LAB — FERRITIN: Ferritin: 25 ng/mL (ref 11–307)

## 2022-08-28 NOTE — Progress Notes (Signed)
  [facility] OFFICE PROGRESS NOTE   Diagnosis: Red cell microcytosis  INTERVAL HISTORY:   Kathleen Bridges returns as scheduled.  No new complaint.  No bleeding other than her menstrual cycle.  The menstrual cycle is heavy for the first few days.  No sign of recurrent DVT.  Objective:  Vital signs in last 24 hours:  Blood pressure (!) 144/89, pulse 88, temperature 98.1 F (36.7 C), temperature source Oral, resp. rate 18, height 6' (1.829 m), weight (!) 416 lb (188.7 kg), SpO2 100%.    Resp: Lungs clear bilaterally Cardio: Regular rate and rhythm GI: No hepatosplenomegaly Vascular: No leg edema   Lab Results:  Lab Results  Component Value Date   WBC 5.2 08/28/2022   HGB 12.2 08/28/2022   HCT 38.1 08/28/2022   MCV 62.7 (L) 08/28/2022   PLT 249 08/28/2022   NEUTROABS 2.3 08/28/2022    CMP  Lab Results  Component Value Date   NA 137 07/10/2022   K CANCELED 07/10/2022   CL 104 07/10/2022   CO2 20 07/10/2022   GLUCOSE 89 07/10/2022   BUN 12 07/10/2022   CREATININE 0.69 07/10/2022   CALCIUM 8.6 (L) 07/10/2022   PROT 7.3 07/10/2022   ALBUMIN 3.9 (L) 07/10/2022   AST 38 07/10/2022   ALT 17 07/10/2022   ALKPHOS 67 07/10/2022   BILITOT 0.4 07/10/2022   GFRNONAA >60 07/30/2021   GFRAA >60 10/06/2019     Medications: I have reviewed the patient's current medications.   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  The hemoglobin is in low normal range.   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  She most likely has a component of mild iron deficiency in addition to the beta thalassemia trait.  I discussed the genetic implications of the beta thalassemia finding.  She will alert her physician if she becomes pregnant.  We made a referral to the genetics counselor.  Kathleen Bridges continue iron.  She will continue clinical follow-up with Lynn Ito.  I recommend she have a follow-up CBC once or twice a year.  We are available to see her again if she develops progressive anemia or a new hematologic abnormality.  Thornton Papas, MD  08/28/2022  10:35 AM

## 2022-09-28 ENCOUNTER — Encounter: Payer: Self-pay | Admitting: Nurse Practitioner

## 2022-12-07 IMAGING — DX DG HIP (WITH OR WITHOUT PELVIS) 2-3V*L*
3 series · 3 of 3 positions shown · non-contrast
Comparison: None Available.

CLINICAL DATA: Left hip pain

EXAM:
DG HIP (WITH OR WITHOUT PELVIS) 2-3V LEFT

[pelvis ap]
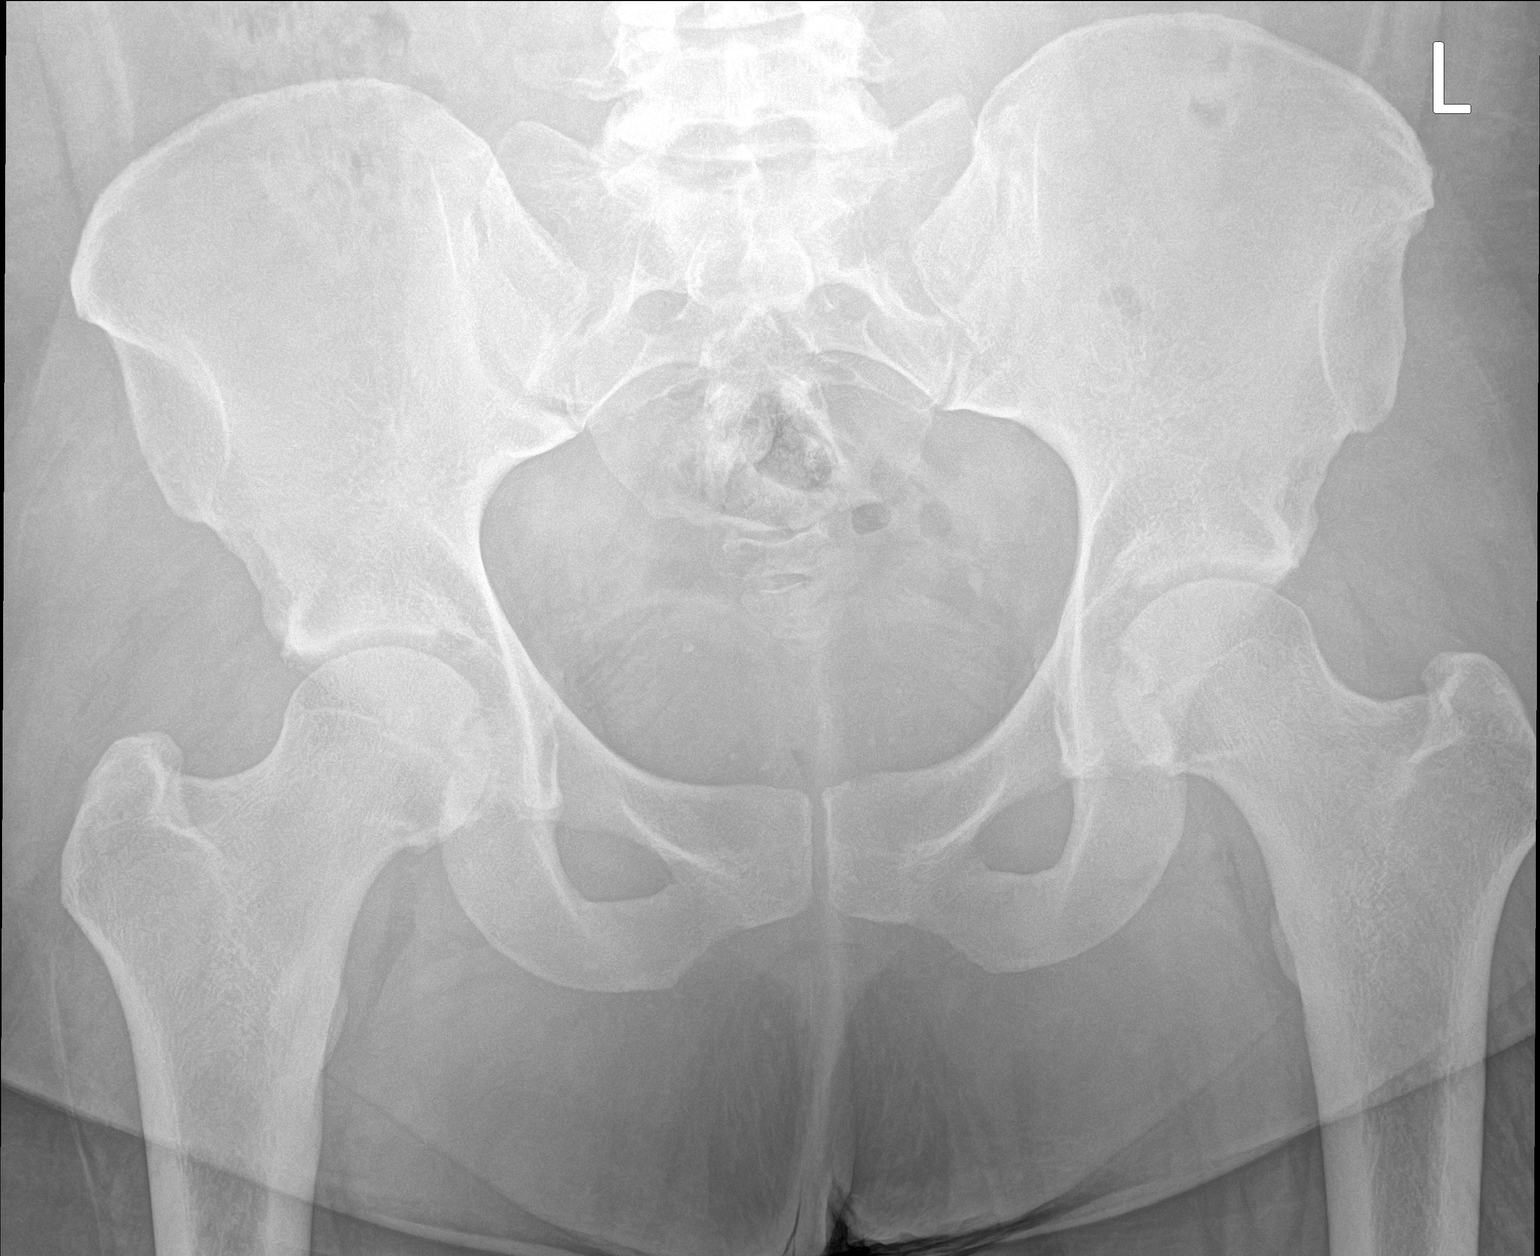

[hip ap]
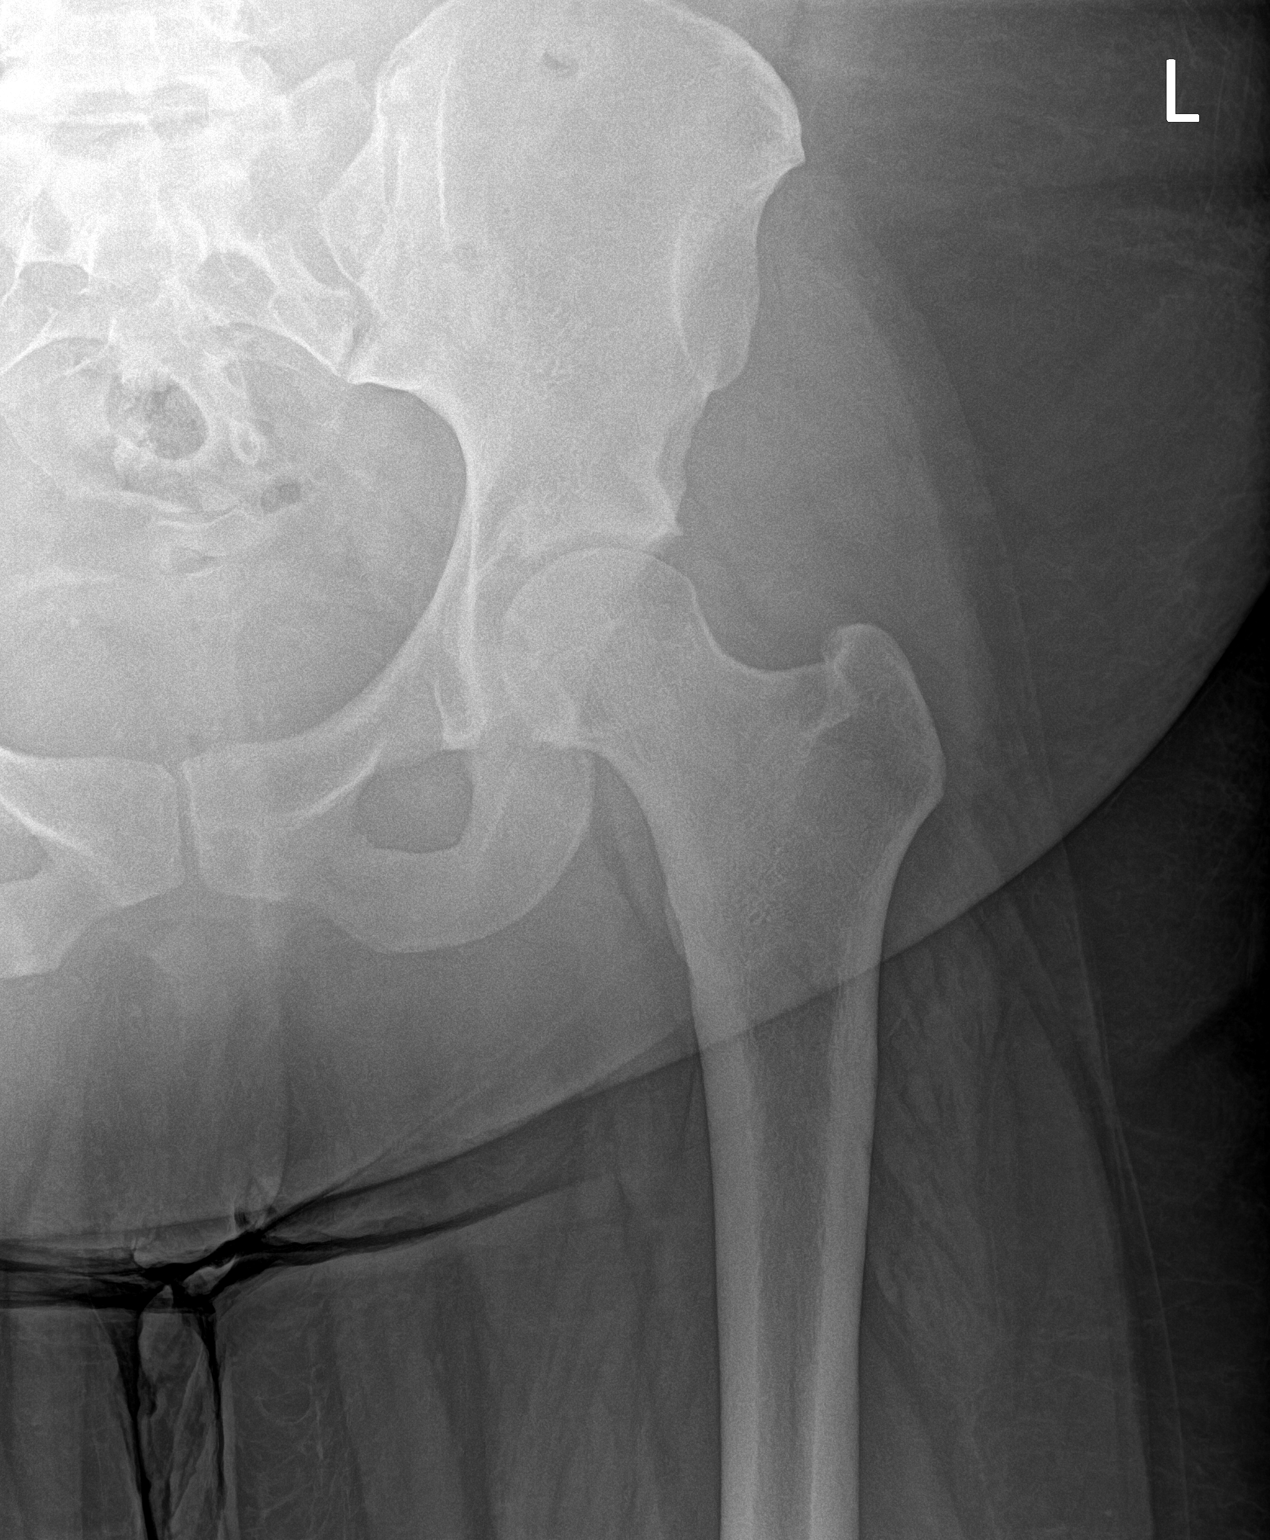

[hip lat]
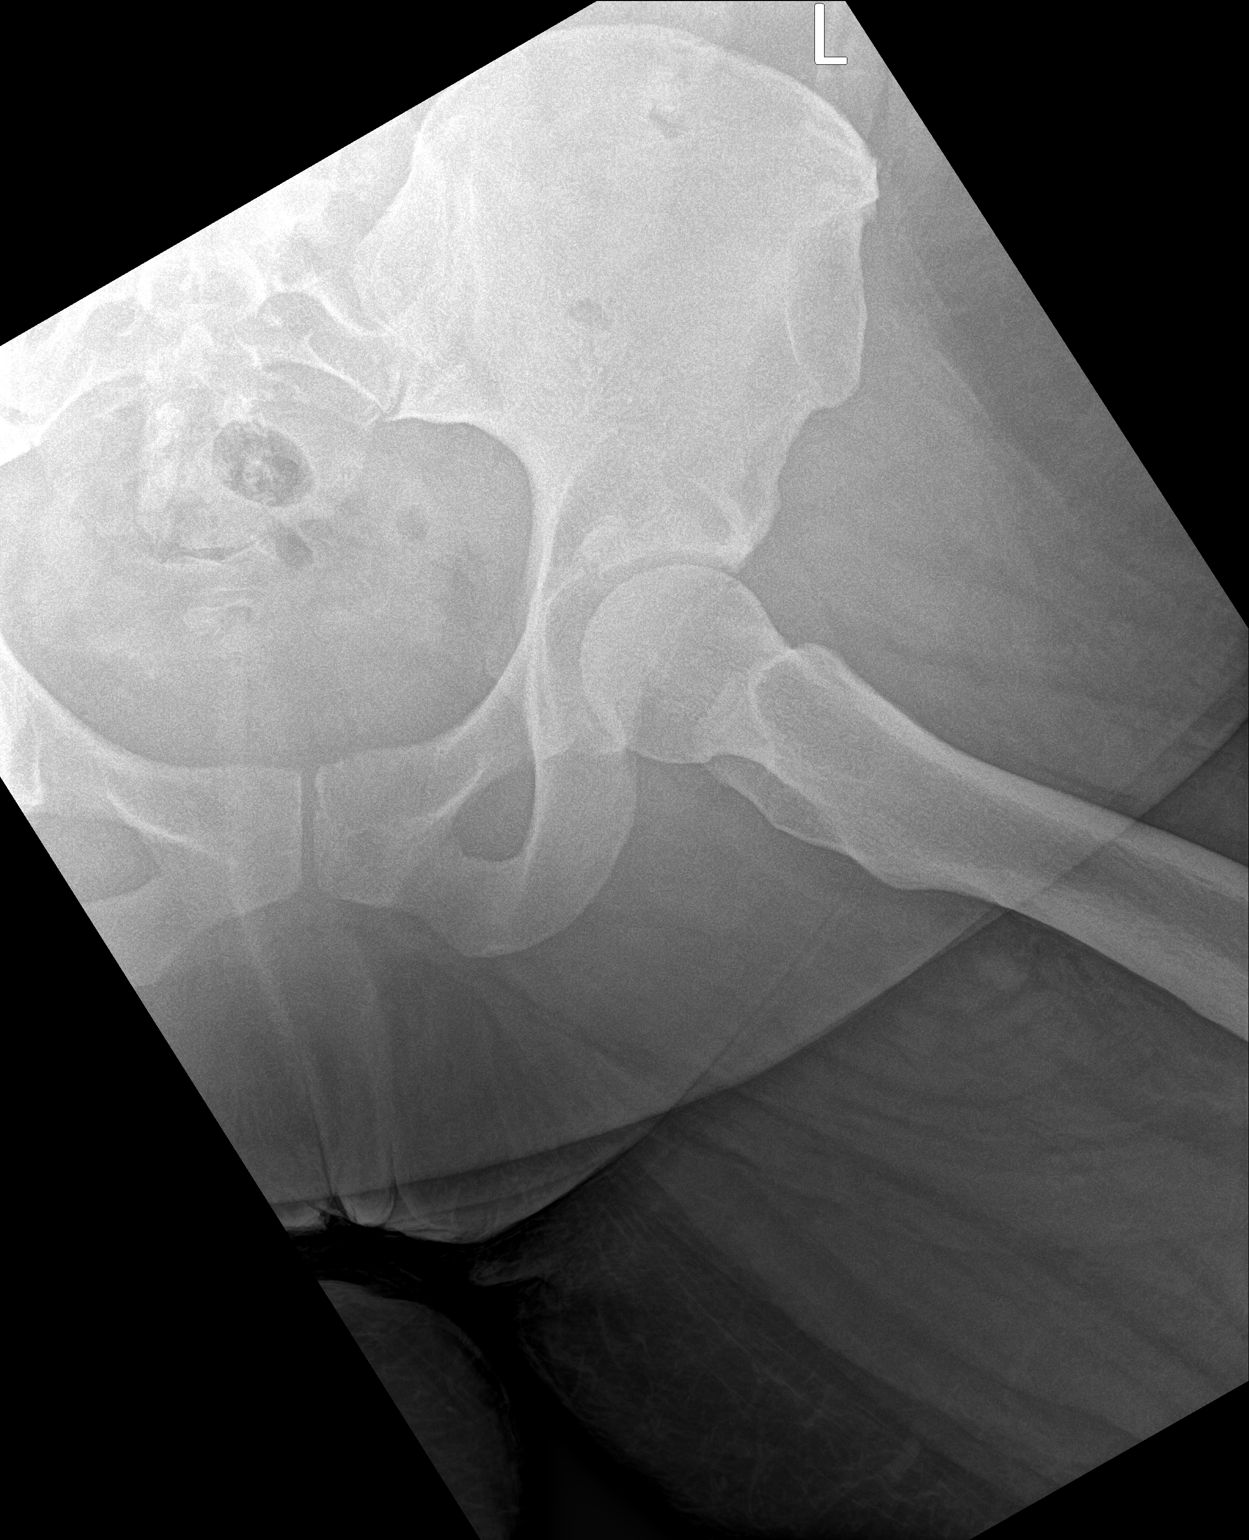

[3 of 3 positions shown; findings below may reference images not displayed]

FINDINGS: There is no evidence of hip fracture or dislocation. There is no
evidence of arthropathy or other focal bone abnormality.
IMPRESSION: Negative.

## 2022-12-31 ENCOUNTER — Ambulatory Visit (INDEPENDENT_AMBULATORY_CARE_PROVIDER_SITE_OTHER): Payer: No Typology Code available for payment source

## 2022-12-31 ENCOUNTER — Ambulatory Visit: Payer: No Typology Code available for payment source | Admitting: Podiatry

## 2022-12-31 ENCOUNTER — Encounter: Payer: Self-pay | Admitting: Podiatry

## 2022-12-31 VITALS — Ht 72.0 in | Wt >= 6400 oz

## 2022-12-31 DIAGNOSIS — M2041 Other hammer toe(s) (acquired), right foot: Secondary | ICD-10-CM | POA: Diagnosis not present

## 2022-12-31 DIAGNOSIS — M778 Other enthesopathies, not elsewhere classified: Secondary | ICD-10-CM

## 2022-12-31 DIAGNOSIS — M2042 Other hammer toe(s) (acquired), left foot: Secondary | ICD-10-CM | POA: Diagnosis not present

## 2022-12-31 DIAGNOSIS — M722 Plantar fascial fibromatosis: Secondary | ICD-10-CM | POA: Diagnosis not present

## 2022-12-31 NOTE — Progress Notes (Addendum)
Subjective:   Patient ID: Kathleen Bridges, female   DOB: 27 y.o.   MRN: [id number]   HPI Patient presents with painful hammertoes on both feet which have been increasingly hard for her to wear shoe gear with and chronic inflammation of the heel region bilateral that she manages herself.  Patient does not smoke likes to be active and is trying to lose weight currently   Review of Systems  All other systems reviewed and are negative.       Objective:  Physical Exam Vitals and nursing note reviewed.  Constitutional:      Appearance: She is well-developed.  Pulmonary:     Effort: Pulmonary effort is normal.  Musculoskeletal:        General: Normal range of motion.  Skin:    General: Skin is warm.  Neurological:     Mental Status: She is alert.     Neurovascular status intact muscle strength found to be adequate range of motion adequate with patient found to have elevated rigid contracture digit to left foot painful with keratotic lesion on the head of the proximal phalanx with digital deformity distal digits 4 and 5 both feet that are painful and she is tried wider shoes trimming and padding without relief and moderate inflammation of the plantar fascia at insertion bilateral.  Good digital perfusion well-oriented     Assessment:  Chronic hammertoe deformity bilateral fourth and fifth with second left and moderate fascial inflammation bilateral      Plan:  H&P reviewed both conditions and at this point we discussed treatment options and she wants the digits fixed due to long-term chronic nature of condition.  I have recommended digital fusion digit to left arthroplasty digits 4 and 5 of both feet with patient wanting these to be done and wants to review now and have the surgery when she returns from her trip in mid February.  I allowed her to read consent form line by line going over alternative treatments complications for digital fusion digit to left and arthroplasty digits 4 and  5 bilateral.  I explained procedures and risk and she wants the procedures understanding total recovery.  Will take 8 to 12 weeks.  Patient scheduled for outpatient surgery all questions are answered today and she signed consent forms.  I also reviewed with her her history of DVT right and she stated that was over 5 years ago and she has been completely released did have genetic testing with no indication of pathology and has had no other issues with it so we will keep her on a baby aspirin a day during the postoperative period but I do not see high risk with this procedure that were doing  X-rays indicate significant rotation digits 4 5 both feet rigid contracture digit to left with keratotic lesion formation and pain

## 2023-02-06 ENCOUNTER — Telehealth: Payer: Self-pay | Admitting: Podiatry

## 2023-02-06 NOTE — Telephone Encounter (Signed)
 DOS-03/05/23  HAMMERTOE REPAIR 4,5 RT AND 2,4,5 [id number]  [employer] EFFECTIVE DATE- 04/02/19  DEDUCTIBLE- $0.00 OOP-$2500.00 WITH REMAINING $2,438.34  COINSURANCE- 0%  SPOKE WITH MELISSA G FROM [employer] AND SHE STATED THAT PRIOR AUTH IS NOT REQUIRED FOR CPT CODE 71714.  CALL REF #: MELISSA G 02/06/23 @9 :48 AM EST

## 2023-03-11 ENCOUNTER — Encounter: Payer: No Typology Code available for payment source | Admitting: Podiatry

## 2023-03-25 ENCOUNTER — Encounter: Payer: No Typology Code available for payment source | Admitting: Podiatry

## 2023-04-12 ENCOUNTER — Telehealth: Payer: Self-pay | Admitting: Podiatry

## 2023-04-12 NOTE — Telephone Encounter (Signed)
 See notes

## 2023-04-15 MED ORDER — HYDROCODONE-ACETAMINOPHEN 10-325 MG PO TABS: 1.0000 | ORAL_TABLET | Freq: Four times a day (QID) | ORAL | 0 refills | Status: AC | PRN

## 2023-04-15 NOTE — Addendum Note (Signed)
 Addended by: Brandt Cake on: 04/15/2023 01:51 PM   Modules accepted: Orders

## 2023-04-16 DIAGNOSIS — M2042 Other hammer toe(s) (acquired), left foot: Secondary | ICD-10-CM | POA: Diagnosis not present

## 2023-04-16 DIAGNOSIS — M2041 Other hammer toe(s) (acquired), right foot: Secondary | ICD-10-CM | POA: Diagnosis not present

## 2023-04-22 ENCOUNTER — Encounter: Payer: Self-pay | Admitting: Podiatry

## 2023-04-22 ENCOUNTER — Ambulatory Visit (INDEPENDENT_AMBULATORY_CARE_PROVIDER_SITE_OTHER)

## 2023-04-22 ENCOUNTER — Ambulatory Visit (INDEPENDENT_AMBULATORY_CARE_PROVIDER_SITE_OTHER): Admitting: Podiatry

## 2023-04-22 DIAGNOSIS — Z9889 Other specified postprocedural states: Secondary | ICD-10-CM | POA: Diagnosis not present

## 2023-04-22 DIAGNOSIS — M2042 Other hammer toe(s) (acquired), left foot: Secondary | ICD-10-CM

## 2023-04-22 DIAGNOSIS — M2041 Other hammer toe(s) (acquired), right foot: Secondary | ICD-10-CM

## 2023-04-22 NOTE — Progress Notes (Signed)
   Chief Complaint  Patient presents with   Routine Post Op    POV #1, DOS 04/16/23, FUSION WITH PIN 2ND TOE LT, DISTAL ARTHROPLASTY DIGITS 4-5 B/L/DR REGAL PT     Subjective:  Patient presents today status post hammertoe repair to the 4th and 5th digits bilateral as well as the second digit to the left foot.  Dr. Celia Coles.  DOS: 04/16/2023.  Patient doing well.  WBAT surgical shoes and dressings are clean dry and intact.  Past Medical History:  Diagnosis Date   Anemia    Anxiety    Clotting disorder (HCC)    DVT (deep venous thrombosis) (HCC)    Headache(784.0)    PCOS (polycystic ovarian syndrome)    Prediabetes    Tachycardia 10/12/2019    Past Surgical History:  Procedure Laterality Date   NO PAST SURGERIES     WISDOM TOOTH EXTRACTION      No Known Allergies  Objective/Physical Exam Neurovascular status intact.  Incision well coapted with sutures intact. No sign of infectious process noted. No dehiscence. No active bleeding noted.  Moderate edema noted to the toes.  Radiographic Exam B/L feet 04/22/2023:  Percutaneous fixation pin noted to the second digit of the left foot with rectus alignment.  Arthroplasties noted to the 4th and 5th digits bilateral  Assessment: 1. s/p hammertoe repair 4-5 b/l. 2nd lt. DOS: 04/16/2023   Plan of Care:  -Patient was evaluated. X-rays reviewed - Dressings changed.  Patient may begin washing and showering getting the foot wet -Continue minimal WBAT surgical shoes.  New surgical shoes were dispensed today because the surgical shoes that she currently wears are oversized to significantly -Refrain from work.  Expected 8-12 weeks out of work -Return to clinic 1 week suture removal  *Works at [employer]    Dot Gazella, DPM [facility]  Dr. Dot Gazella, DPM    [address] [place], [place] [address]                Office [phone]  Fax [phone]

## 2023-04-24 ENCOUNTER — Telehealth: Payer: Self-pay | Admitting: Podiatry

## 2023-04-24 DIAGNOSIS — Z0271 Encounter for disability determination: Secondary | ICD-10-CM

## 2023-04-24 NOTE — Telephone Encounter (Signed)
 Pt left mess on my vmail to see if forms were recd from [employer]. I called her back to adv they were and completed and faxed [phone] for her RTW date 07/01/23

## 2023-05-06 ENCOUNTER — Encounter: Payer: Self-pay | Admitting: Podiatry

## 2023-05-06 ENCOUNTER — Ambulatory Visit (INDEPENDENT_AMBULATORY_CARE_PROVIDER_SITE_OTHER): Admitting: Podiatry

## 2023-05-06 ENCOUNTER — Encounter: Payer: No Typology Code available for payment source | Admitting: Podiatry

## 2023-05-06 VITALS — Ht 72.0 in | Wt >= 6400 oz

## 2023-05-06 DIAGNOSIS — M2042 Other hammer toe(s) (acquired), left foot: Secondary | ICD-10-CM

## 2023-05-06 DIAGNOSIS — M2041 Other hammer toe(s) (acquired), right foot: Secondary | ICD-10-CM

## 2023-05-06 NOTE — Progress Notes (Signed)
   Chief Complaint  Patient presents with   Routine Post Op    POV #2, DOS 04/16/23, FUSION WITH PIN 2ND TOE LT, DISTAL ARTHROPLASTY DIGITS 4-5 B/L/DR REGAL PT    Subjective:  Patient presents today status post hammertoe repair to the 4th and 5th digits bilateral as well as the second digit to the left foot.  Dr. Celia Coles.  DOS: 04/16/2023.  Patient doing well.  WBAT surgical shoes and dressings are clean dry and intact.  Past Medical History:  Diagnosis Date   Anemia    Anxiety    Clotting disorder (HCC)    DVT (deep venous thrombosis) (HCC)    Headache(784.0)    PCOS (polycystic ovarian syndrome)    Prediabetes    Tachycardia 10/12/2019    Past Surgical History:  Procedure Laterality Date   NO PAST SURGERIES     WISDOM TOOTH EXTRACTION      No Known Allergies  Objective/Physical Exam Neurovascular status intact.  Incision well coapted with sutures intact. No sign of infectious process noted. No dehiscence. No active bleeding noted.  Minimal edema noted to the toes.  Overall well-healing surgical toes  Radiographic Exam B/L feet 04/22/2023:  Percutaneous fixation pin noted to the second digit of the left foot with rectus alignment.  Arthroplasties noted to the 4th and 5th digits bilateral  Assessment: 1. s/p hammertoe repair 4-5 b/l. 2nd lt. DOS: 04/16/2023.  Dr. Celia Coles   Plan of Care:  -Patient was evaluated.  -Sutures removed -Patient may transition into tennis shoes to the right foot.  We will leave the percutaneous pin into the second toe for an additional 2 weeks.  Continue postop shoe WBAT left foot x 2 weeks -Refrain from work.  Expected 8-12 weeks out of work -Return to clinic 2 weeks percutaneous pin removal  *Works at [employer]    Dot Gazella, DPM [facility]  Dr. Dot Gazella, DPM    [address] [place], [place] [address]                Office [phone]  Fax [phone]

## 2023-05-07 ENCOUNTER — Other Ambulatory Visit: Payer: Self-pay | Admitting: Physician Assistant

## 2023-05-07 NOTE — Telephone Encounter (Signed)
 called pt back from mess lft on my vmail requesting copy of her forms emailed to her. Called back and confirmed email address and I would email it for her.

## 2023-05-20 ENCOUNTER — Encounter: Payer: No Typology Code available for payment source | Admitting: Podiatry

## 2023-05-20 ENCOUNTER — Ambulatory Visit (INDEPENDENT_AMBULATORY_CARE_PROVIDER_SITE_OTHER): Admitting: Podiatry

## 2023-05-20 ENCOUNTER — Ambulatory Visit (INDEPENDENT_AMBULATORY_CARE_PROVIDER_SITE_OTHER)

## 2023-05-20 DIAGNOSIS — Z9889 Other specified postprocedural states: Secondary | ICD-10-CM

## 2023-05-20 DIAGNOSIS — M2042 Other hammer toe(s) (acquired), left foot: Secondary | ICD-10-CM

## 2023-05-20 NOTE — Progress Notes (Addendum)
   Chief Complaint  Patient presents with   POV 3    DOS 04/16/23 POV 3, Pin removal and xrays. Reports a some pain still. Mainly in her 5th toes She was in Post op shoe with regular sock. Pin cap intact. Not diabetic and no anti coag.     Subjective:  Patient presents today status post hammertoe repair to the 4th and 5th digits bilateral as well as the second digit to the left foot.  Dr. Celia Coles.  DOS: 04/16/2023.  WBAT surgical shoe  Past Medical History:  Diagnosis Date   Anemia    Anxiety    Clotting disorder (HCC)    DVT (deep venous thrombosis) (HCC)    Headache(784.0)    PCOS (polycystic ovarian syndrome)    Prediabetes    Tachycardia 10/12/2019    Past Surgical History:  Procedure Laterality Date   NO PAST SURGERIES     WISDOM TOOTH EXTRACTION      No Known Allergies  Objective/Physical Exam Neurovascular status intact.  Incision nicely healed.  Mild edema noted to the toes.  Overall well-healing surgical foot.  No indication of infection  Radiographic Exam B/L feet 04/22/2023:  Percutaneous fixation pin noted to the second digit of the left foot with rectus alignment.  Arthroplasties noted to the 4th and 5th digits bilateral  Assessment: 1. s/p hammertoe repair 4-5 b/l. 2nd lt. DOS: 04/16/2023.  Dr. Celia Coles   Plan of Care:  -Patient was evaluated.  -Percutaneous fixation pin removed today. -Okay to transition out of the surgical shoe into good supportive tennis shoes over the next few weeks. -Return to clinic 5 weeks follow-up x-ray  *Works at [employer].  Expected return to work date 07/01/2023    Dot Gazella, DPM [facility]  Dr. Dot Gazella, DPM    [address] [place], [place] [address]                Office [phone]  Fax [phone]

## 2023-06-10 ENCOUNTER — Telehealth: Payer: Self-pay | Admitting: Podiatry

## 2023-06-10 NOTE — Telephone Encounter (Signed)
 lft mess on vmail of pt to confirm what she wants on note for job. I emailed her as well to let me know and if she still wants RTW date as 07/01/23.

## 2023-06-11 ENCOUNTER — Telehealth: Payer: Self-pay | Admitting: Podiatry

## 2023-06-11 ENCOUNTER — Encounter: Payer: Self-pay | Admitting: Podiatry

## 2023-06-11 NOTE — Telephone Encounter (Signed)
 returned pt's vmail mess to me and email. She is needing update note for [employer] no her RTW. I will update again possibly after 06/24/23 visit. She said she is still wearing surgical shoe and it still hurts. She knows she can't put steet toe boots/shoes on

## 2023-06-20 ENCOUNTER — Telehealth: Payer: Self-pay | Admitting: Physician Assistant

## 2023-06-20 NOTE — Telephone Encounter (Signed)
 Left vm and sent mychart message to confirm 06/27/23 appointment-Toni

## 2023-06-24 ENCOUNTER — Encounter: Payer: Self-pay | Admitting: Podiatry

## 2023-06-24 ENCOUNTER — Ambulatory Visit (INDEPENDENT_AMBULATORY_CARE_PROVIDER_SITE_OTHER)

## 2023-06-24 ENCOUNTER — Ambulatory Visit (INDEPENDENT_AMBULATORY_CARE_PROVIDER_SITE_OTHER): Admitting: Podiatry

## 2023-06-24 VITALS — Ht 72.0 in | Wt >= 6400 oz

## 2023-06-24 DIAGNOSIS — M2042 Other hammer toe(s) (acquired), left foot: Secondary | ICD-10-CM

## 2023-06-25 NOTE — Progress Notes (Signed)
 Subjective:   Patient ID: Kathleen Bridges, female   DOB: 28 y.o.   MRN: [id number]   HPI Patient states doing very well with surgery very pleased   ROS      Objective:  Physical Exam  Vascular status intact negative [place]' sign noted wound edges well coapted toes have good alignment cord formation is no longer present     Assessment:  Doing well post foot surgery left     Plan:  H&P reviewed and I am allowing patient to return to normal activity and patient will be seen back to recheck all questions answered today  X-rays indicate that good alignment is noted there is good to be some slight secondary healing of a couple of the toes but it is uneventful and should heal very well

## 2023-06-26 NOTE — Telephone Encounter (Signed)
 Called and lft mess on vmail for pt to call me back about note for [employer].

## 2023-06-27 ENCOUNTER — Encounter: Payer: Self-pay | Admitting: Podiatry

## 2023-06-27 ENCOUNTER — Encounter: Payer: No Typology Code available for payment source | Admitting: Physician Assistant

## 2023-06-27 NOTE — Telephone Encounter (Signed)
 s/w pt and needs note with accommadations- breaks every 2 hours, no more than 40 hrs per week, and she must wear tennis shoes only 07/01/23-10/01/23

## 2023-07-11 ENCOUNTER — Encounter: Payer: Self-pay | Admitting: Podiatry

## 2023-07-11 NOTE — Telephone Encounter (Signed)
 Pt send email to advise the breaks need to be every 2 hours. I will revise the letter and email her a copy and fax to [employer].

## 2023-11-20 ENCOUNTER — Ambulatory Visit

## 2023-11-20 ENCOUNTER — Ambulatory Visit: Admitting: Podiatry

## 2023-11-20 DIAGNOSIS — M7751 Other enthesopathy of right foot: Secondary | ICD-10-CM

## 2023-11-20 DIAGNOSIS — M779 Enthesopathy, unspecified: Secondary | ICD-10-CM | POA: Diagnosis not present

## 2023-11-20 MED ORDER — TRIAMCINOLONE ACETONIDE 10 MG/ML IJ SUSP
10.0000 mg | Freq: Once | INTRAMUSCULAR | Status: AC
  Administered 2023-11-20: 10 mg via INTRA_ARTICULAR

## 2023-11-20 MED ORDER — DICLOFENAC SODIUM 75 MG PO TBEC: 75.0000 mg | DELAYED_RELEASE_TABLET | Freq: Two times a day (BID) | ORAL | 2 refills | Status: AC

## 2023-11-21 NOTE — Progress Notes (Signed)
 Subjective:   Patient ID: Kathleen Bridges, female   DOB: 28 y.o.   MRN: [id number]   HPI Patient states she stepped in a mud hole on her right outside foot and ankle and states has been hurting for several weeks.  Did not feel any snap in its just uncomfortable to walk on   ROS      Objective:  Physical Exam  Vascular status intact with inflammation more of the tendon groove right I did not note any excessive swelling lateral and into the ankle joint     Assessment:  Probability for tendinitis right does not appear to be any kind of fracture formation     Plan:  H&P precautionary x-ray taken sterile prep injected the tendon complex 3 mg Kenalog  5 mg Xylocaine applied sterile dressing reappoint to recheck  X-rays were negative for signs of fracture or bony pathology
